# Patient Record
Sex: Male | Born: 1965 | Race: Black or African American | Hispanic: No | Marital: Single | Smoking: Former smoker
Health system: Southern US, Community
[De-identification: ages and names within clinical notes are randomized; demographics above are authoritative.]

## PROBLEM LIST (undated history)

## (undated) ENCOUNTER — Ambulatory Visit

## (undated) ENCOUNTER — Encounter: Attending: Adult Health | Primary: Adult Health

## (undated) ENCOUNTER — Ambulatory Visit: Payer: Medicare (Managed Care)

## (undated) ENCOUNTER — Encounter: Attending: Nurse Practitioner | Primary: Nurse Practitioner

## (undated) ENCOUNTER — Ambulatory Visit: Attending: Orthopaedic Surgery | Primary: Orthopaedic Surgery

## (undated) ENCOUNTER — Telehealth

## (undated) ENCOUNTER — Encounter

## (undated) ENCOUNTER — Ambulatory Visit: Payer: MEDICARE

## (undated) ENCOUNTER — Encounter: Attending: Ambulatory Care | Primary: Ambulatory Care

## (undated) ENCOUNTER — Ambulatory Visit: Attending: Cardiovascular Disease | Primary: Cardiovascular Disease

## (undated) ENCOUNTER — Ambulatory Visit: Payer: PRIVATE HEALTH INSURANCE

## (undated) ENCOUNTER — Ambulatory Visit: Payer: MEDICARE | Attending: Orthopaedic Surgery | Primary: Orthopaedic Surgery

## (undated) ENCOUNTER — Ambulatory Visit: Attending: Mental Health | Primary: Mental Health

## (undated) ENCOUNTER — Ambulatory Visit: Payer: Medicare (Managed Care) | Attending: Dermatology | Primary: Dermatology

## (undated) ENCOUNTER — Ambulatory Visit: Payer: Medicare (Managed Care) | Attending: Internal Medicine | Primary: Internal Medicine

## (undated) ENCOUNTER — Encounter: Attending: Gastroenterology | Primary: Gastroenterology

## (undated) ENCOUNTER — Ambulatory Visit: Payer: MEDICARE | Attending: Oncology | Primary: Oncology

## (undated) ENCOUNTER — Encounter: Attending: Oncology | Primary: Oncology

## (undated) ENCOUNTER — Ambulatory Visit: Payer: Medicare (Managed Care) | Attending: Adult Health | Primary: Adult Health

## (undated) ENCOUNTER — Ambulatory Visit: Payer: Medicare (Managed Care) | Attending: Hematology & Oncology | Primary: Hematology & Oncology

## (undated) ENCOUNTER — Other Ambulatory Visit

## (undated) ENCOUNTER — Ambulatory Visit: Payer: PRIVATE HEALTH INSURANCE | Attending: Adult Health | Primary: Adult Health

## (undated) ENCOUNTER — Encounter: Attending: Dermatology | Primary: Dermatology

## (undated) ENCOUNTER — Encounter: Attending: Internal Medicine | Primary: Internal Medicine

## (undated) ENCOUNTER — Ambulatory Visit: Payer: PRIVATE HEALTH INSURANCE | Attending: Cardiovascular Disease | Primary: Cardiovascular Disease

## (undated) ENCOUNTER — Encounter: Attending: Cardiovascular Disease | Primary: Cardiovascular Disease

## (undated) ENCOUNTER — Ambulatory Visit: Attending: Adult Health | Primary: Adult Health

## (undated) ENCOUNTER — Ambulatory Visit
Attending: Student in an Organized Health Care Education/Training Program | Primary: Student in an Organized Health Care Education/Training Program

## (undated) ENCOUNTER — Telehealth: Attending: Neurology | Primary: Neurology

## (undated) ENCOUNTER — Inpatient Hospital Stay: Payer: Medicare (Managed Care)

## (undated) ENCOUNTER — Telehealth
Payer: PRIVATE HEALTH INSURANCE | Attending: Student in an Organized Health Care Education/Training Program | Primary: Student in an Organized Health Care Education/Training Program

## (undated) DIAGNOSIS — I2699 Other pulmonary embolism without acute cor pulmonale: Secondary | ICD-10-CM

## (undated) DIAGNOSIS — D869 Sarcoidosis, unspecified: Secondary | ICD-10-CM

## (undated) DIAGNOSIS — K5792 Diverticulitis of intestine, part unspecified, without perforation or abscess without bleeding: Secondary | ICD-10-CM

## (undated) DIAGNOSIS — E119 Type 2 diabetes mellitus without complications: Secondary | ICD-10-CM

## (undated) DIAGNOSIS — F419 Anxiety disorder, unspecified: Secondary | ICD-10-CM

---
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
## 2002-01-13 DIAGNOSIS — I2699 Other pulmonary embolism without acute cor pulmonale: Secondary | ICD-10-CM

## 2002-01-13 HISTORY — PX: COLECTOMY: SHX59

## 2002-01-13 HISTORY — PX: COLOSTOMY REVISION: SHX5232

## 2002-01-13 HISTORY — DX: Other pulmonary embolism without acute cor pulmonale: I26.99

## 2004-12-04 ENCOUNTER — Ambulatory Visit: Payer: Self-pay

## 2005-03-17 ENCOUNTER — Ambulatory Visit: Payer: Self-pay | Admitting: Family Medicine

## 2005-06-13 ENCOUNTER — Ambulatory Visit: Payer: Self-pay

## 2005-07-01 ENCOUNTER — Ambulatory Visit: Payer: Self-pay

## 2008-03-07 ENCOUNTER — Ambulatory Visit: Payer: Self-pay | Admitting: Internal Medicine

## 2008-07-15 ENCOUNTER — Ambulatory Visit: Payer: Self-pay | Admitting: Internal Medicine

## 2008-07-29 ENCOUNTER — Ambulatory Visit: Payer: Self-pay | Admitting: Internal Medicine

## 2008-08-28 ENCOUNTER — Ambulatory Visit: Payer: Self-pay | Admitting: Internal Medicine

## 2009-04-24 ENCOUNTER — Ambulatory Visit: Payer: Self-pay | Admitting: Internal Medicine

## 2010-01-31 ENCOUNTER — Ambulatory Visit: Payer: Self-pay | Admitting: Family Medicine

## 2010-08-02 ENCOUNTER — Ambulatory Visit: Payer: Self-pay | Admitting: Internal Medicine

## 2011-08-06 ENCOUNTER — Emergency Department: Payer: Self-pay | Admitting: Emergency Medicine

## 2011-08-06 LAB — PROTIME-INR
INR: 1.3
Prothrombin Time: 16.7 secs — ABNORMAL HIGH (ref 11.5–14.7)

## 2011-08-06 LAB — BASIC METABOLIC PANEL
Calcium, Total: 9.4 mg/dL (ref 8.5–10.1)
Chloride: 104 mmol/L (ref 98–107)
Co2: 28 mmol/L (ref 21–32)
Osmolality: 277 (ref 275–301)
Potassium: 4.9 mmol/L (ref 3.5–5.1)
Sodium: 138 mmol/L (ref 136–145)

## 2011-08-06 LAB — CBC
HGB: 15.2 g/dL (ref 13.0–18.0)
MCH: 26.1 pg (ref 26.0–34.0)
RBC: 5.82 10*6/uL (ref 4.40–5.90)

## 2011-09-09 ENCOUNTER — Emergency Department: Payer: Self-pay | Admitting: Emergency Medicine

## 2011-09-22 ENCOUNTER — Ambulatory Visit: Payer: Self-pay | Admitting: Surgery

## 2011-11-08 ENCOUNTER — Ambulatory Visit: Payer: Self-pay | Admitting: Physician Assistant

## 2012-01-14 HISTORY — PX: ABDOMINAL HERNIA REPAIR: SHX539

## 2012-06-15 ENCOUNTER — Ambulatory Visit: Payer: Self-pay | Admitting: Family Medicine

## 2012-06-15 LAB — URINALYSIS, COMPLETE
Bilirubin,UR: NEGATIVE
Blood: NEGATIVE
Glucose,UR: NEGATIVE mg/dL (ref 0–75)
Nitrite: NEGATIVE
Ph: 5 (ref 4.5–8.0)
RBC,UR: NONE SEEN /HPF (ref 0–5)
Specific Gravity: 1.025 (ref 1.003–1.030)

## 2012-06-15 LAB — GC/CHLAMYDIA PROBE AMP

## 2012-08-27 ENCOUNTER — Ambulatory Visit: Payer: Self-pay | Admitting: Surgery

## 2012-08-27 LAB — CBC WITH DIFFERENTIAL/PLATELET
Basophil %: 0.9 %
Eosinophil #: 0.3 10*3/uL (ref 0.0–0.7)
Eosinophil %: 5.2 %
HCT: 45.6 % (ref 40.0–52.0)
HGB: 15.4 g/dL (ref 13.0–18.0)
Lymphocyte %: 30.5 %
MCH: 28.1 pg (ref 26.0–34.0)
MCHC: 33.9 g/dL (ref 32.0–36.0)
Monocyte #: 0.5 x10 3/mm (ref 0.2–1.0)
Neutrophil #: 2.9 10*3/uL (ref 1.4–6.5)
Neutrophil %: 53.5 %
Platelet: 196 10*3/uL (ref 150–440)
WBC: 5.5 10*3/uL (ref 3.8–10.6)

## 2012-08-27 LAB — COMPREHENSIVE METABOLIC PANEL
Alkaline Phosphatase: 56 U/L (ref 50–136)
Anion Gap: 5 — ABNORMAL LOW (ref 7–16)
BUN: 18 mg/dL (ref 7–18)
Bilirubin,Total: 0.3 mg/dL (ref 0.2–1.0)
Calcium, Total: 9.3 mg/dL (ref 8.5–10.1)
Co2: 27 mmol/L (ref 21–32)
Creatinine: 1.28 mg/dL (ref 0.60–1.30)
EGFR (African American): >60
Glucose: 93 mg/dL (ref 65–99)
SGOT(AST): 30 U/L (ref 15–37)
SGPT (ALT): 42 U/L (ref 12–78)
Sodium: 139 mmol/L (ref 136–145)
Total Protein: 7.8 g/dL (ref 6.4–8.2)

## 2012-08-30 ENCOUNTER — Ambulatory Visit: Payer: Self-pay | Admitting: Surgery

## 2012-09-17 ENCOUNTER — Ambulatory Visit: Payer: Self-pay | Admitting: Family Medicine

## 2013-02-09 ENCOUNTER — Emergency Department: Payer: Self-pay | Admitting: Emergency Medicine

## 2013-02-09 LAB — CBC
HCT: 46.1 % (ref 40.0–52.0)
HGB: 15.3 g/dL (ref 13.0–18.0)
MCH: 27.3 pg (ref 26.0–34.0)
MCHC: 33.1 g/dL (ref 32.0–36.0)
MCV: 83 fL (ref 80–100)
PLATELETS: 183 10*3/uL (ref 150–440)
RBC: 5.59 10*6/uL (ref 4.40–5.90)
RDW: 14.3 % (ref 11.5–14.5)
WBC: 4 10*3/uL (ref 3.8–10.6)

## 2013-02-09 LAB — COMPREHENSIVE METABOLIC PANEL
Albumin: 3.6 g/dL (ref 3.4–5.0)
Alkaline Phosphatase: 47 U/L
Anion Gap: 4 — ABNORMAL LOW (ref 7–16)
BILIRUBIN TOTAL: 0.4 mg/dL (ref 0.2–1.0)
BUN: 14 mg/dL (ref 7–18)
Calcium, Total: 8.8 mg/dL (ref 8.5–10.1)
Chloride: 106 mmol/L (ref 98–107)
Co2: 27 mmol/L (ref 21–32)
Creatinine: 1.34 mg/dL — ABNORMAL HIGH (ref 0.60–1.30)
EGFR (African American): >60
EGFR (Non-African Amer.): >60
GLUCOSE: 107 mg/dL — AB (ref 65–99)
OSMOLALITY: 275 (ref 275–301)
POTASSIUM: 4.1 mmol/L (ref 3.5–5.1)
SGOT(AST): 36 U/L (ref 15–37)
SGPT (ALT): 49 U/L (ref 12–78)
SODIUM: 137 mmol/L (ref 136–145)
TOTAL PROTEIN: 7.9 g/dL (ref 6.4–8.2)

## 2013-02-09 LAB — TROPONIN I: Troponin-I: <0.02 ng/mL

## 2013-02-09 LAB — PROTIME-INR
INR: 1.3
PROTHROMBIN TIME: 15.9 s — AB (ref 11.5–14.7)

## 2013-07-22 IMAGING — CT CT ABD-PELV W/O CM
1 of 2 series · 15 of 32 positions shown, 19 images · non-contrast
Comparison: None

REASON FOR EXAM: Lower central abd pain Hx Hernia Eval diverticulitis
COMMENTS:

PROCEDURE:     CT  - CT ABDOMEN AND PELVIS W[DATE]  [DATE]
RESULT:     Indication: 09/22/2011
TECHNIQUE: Multiple axial images from the lung bases to the symphysis pubis
were obtained without oral and without intravenous contrast.

[Series 2: soft tissue · axial · 0.80mm/px · z∈[-870,-366]mm · 15 of 185 slices shown, 19 images]
[im 9/185  soft-tissue]
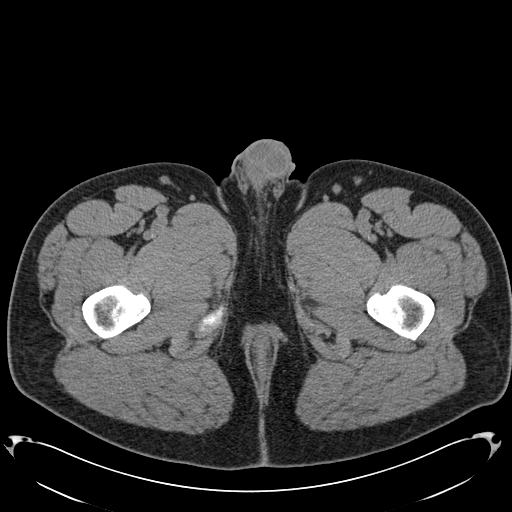
[im 9/185  bone]
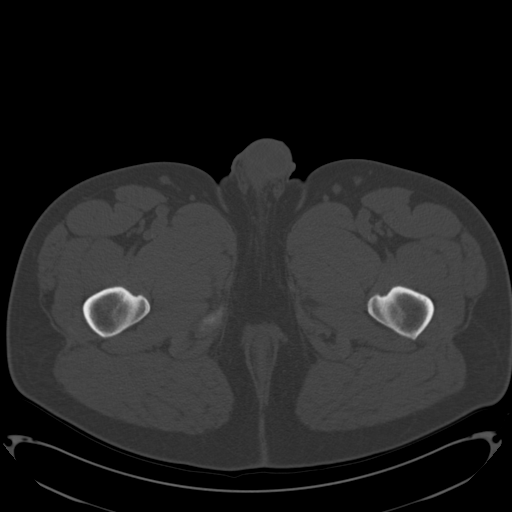
[im 25/185  soft-tissue]
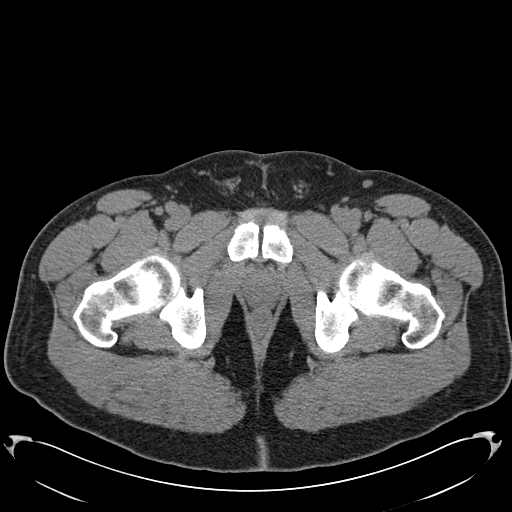
[im 41/185  soft-tissue]
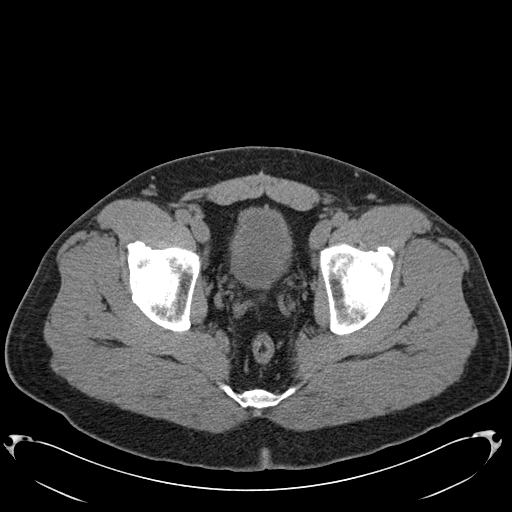
[im 49/185  soft-tissue]
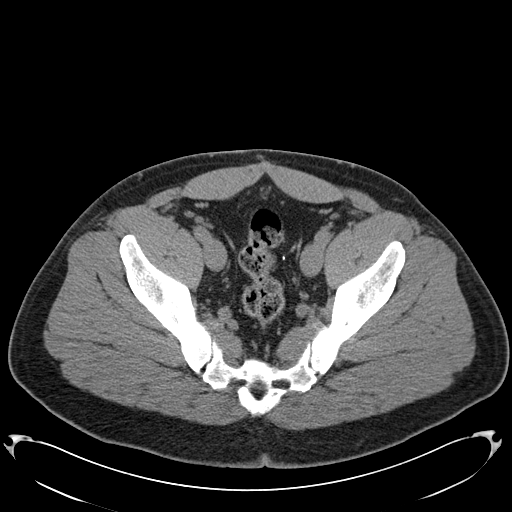
[im 65/185  soft-tissue]
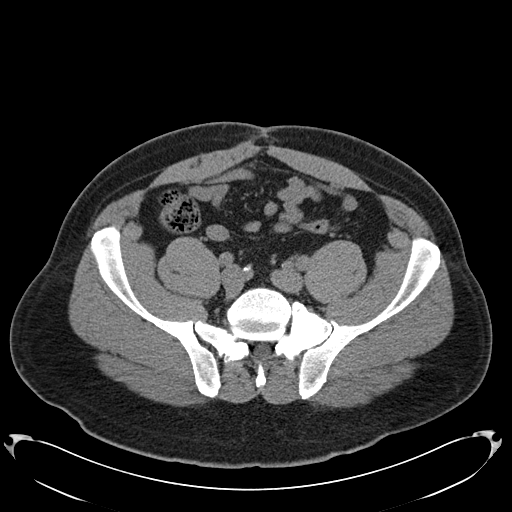
[im 81/185  soft-tissue]
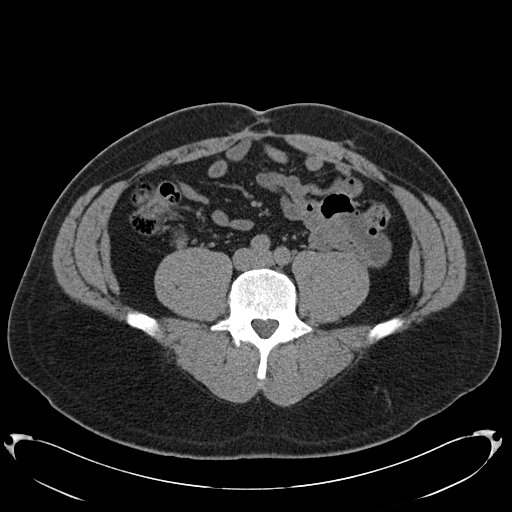
[im 97/185  soft-tissue]
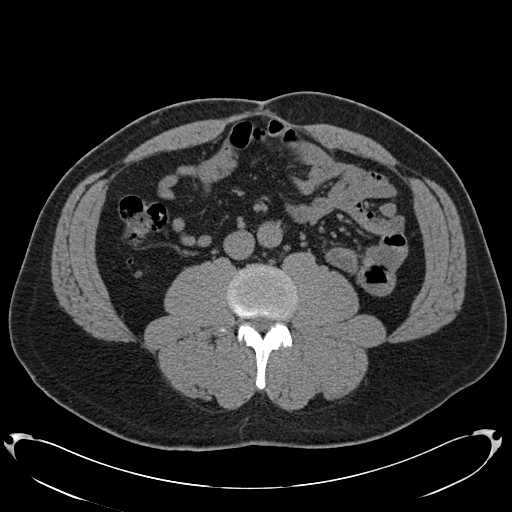
[im 105/185  soft-tissue]
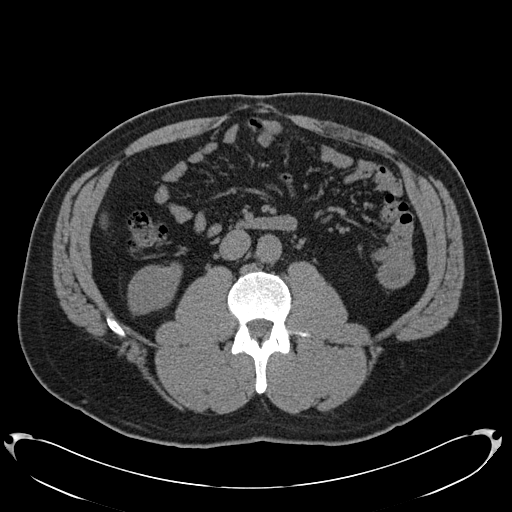
[im 121/185  soft-tissue]
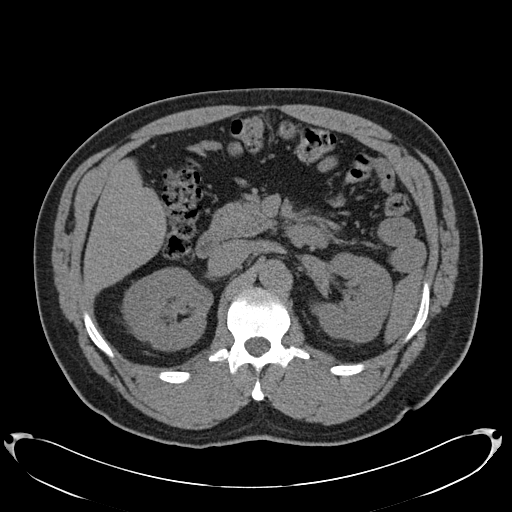
[im 121/185  bone]
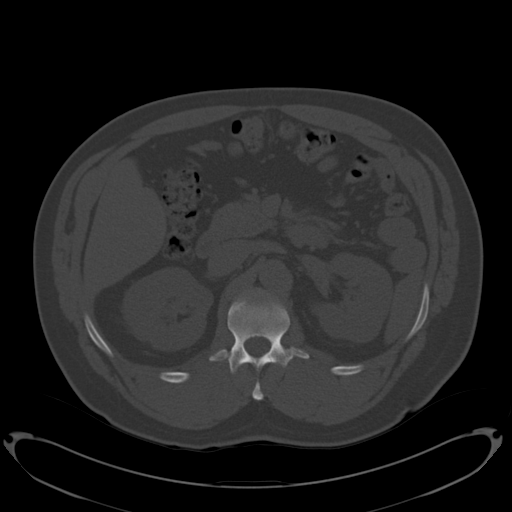
[im 137/185  soft-tissue]
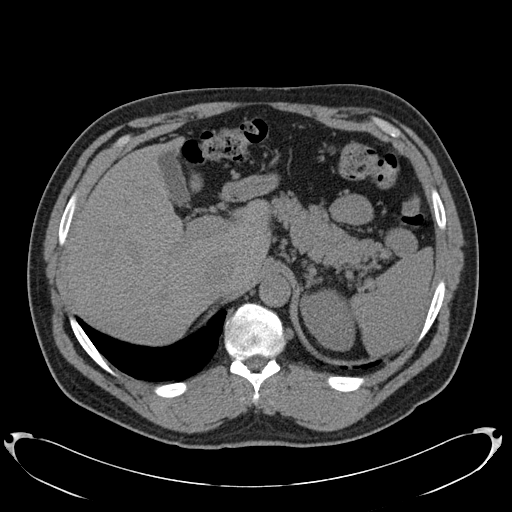
[im 145/185  soft-tissue]
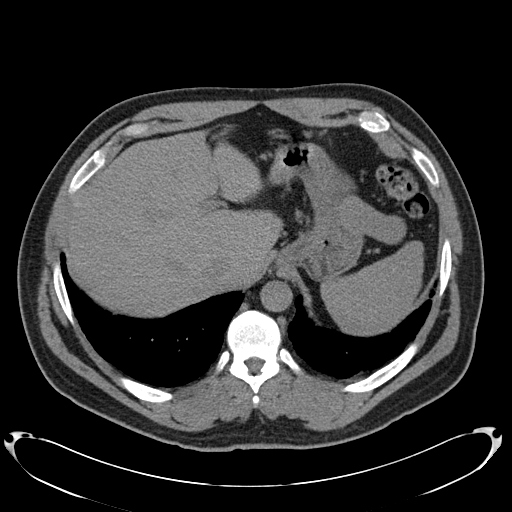
[im 153/185  lung]
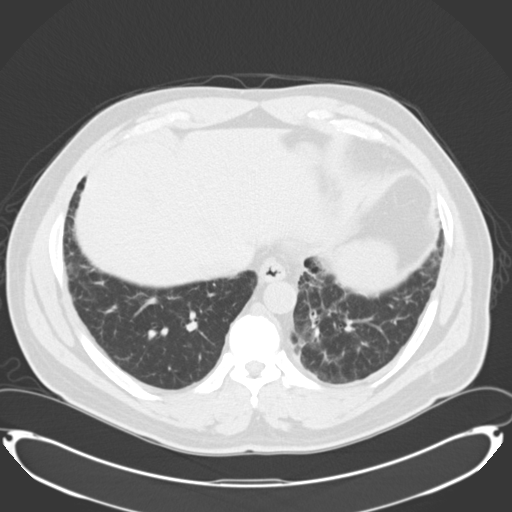
[im 161/185  soft-tissue]
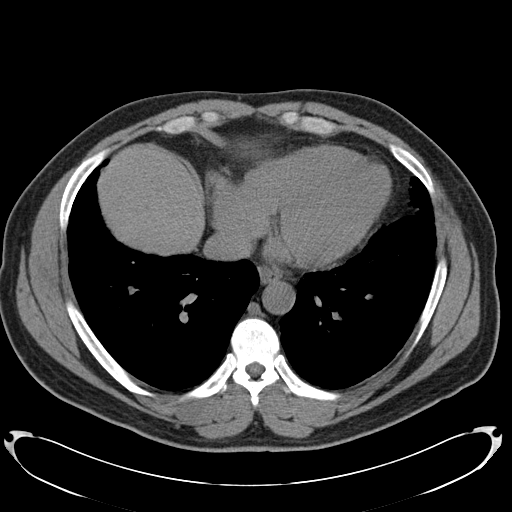
[im 161/185  lung]
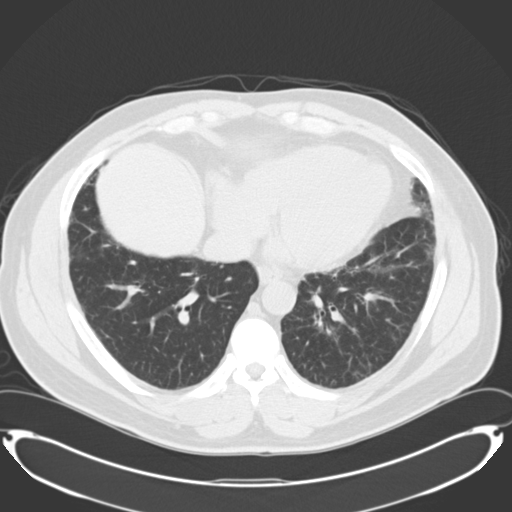
[im 169/185  lung]
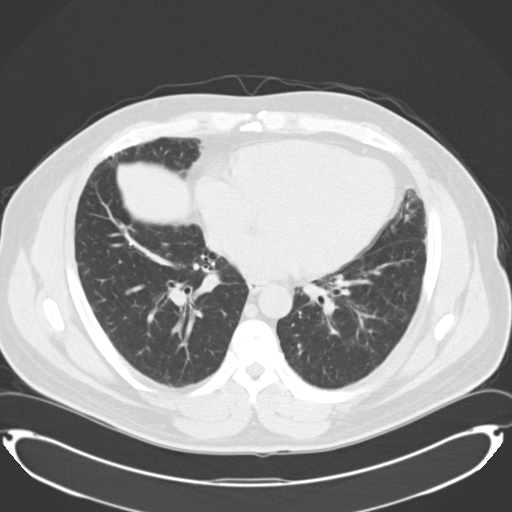
[im 177/185  soft-tissue]
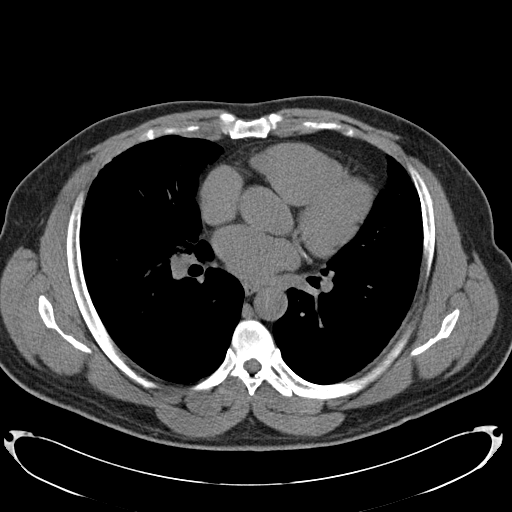
[im 177/185  lung]
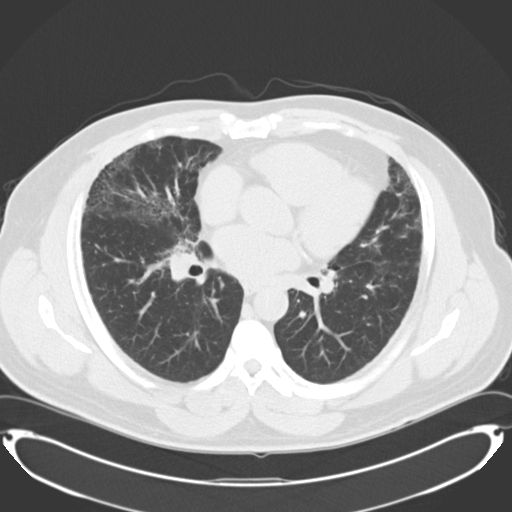

[15 of 32 positions shown; findings below may reference images not displayed]

FINDINGS: The lung bases are clear. There is no pleural or pericardial effusions.

No renal, ureteral, or bladder calculi. No obstructive uropathy. No
perinephric stranding is seen. The kidneys are symmetric in size without
evidence for exophytic mass. The bladder is unremarkable.

The liver demonstrates no focal abnormality. The gallbladder is
unremarkable. The spleen demonstrates no focal abnormality. The adrenal
glands and pancreas are normal.

The unopacified stomach, duodenum, small intestine, and large intestine are
unremarkable, but evaluation is limited by lack of oral contrast. There is a
sigmoid colon anastomotic suture line noted from prior resection. There is a
normal caliber appendix in the right lower quadrant without periappendiceal
inflammatory changes. There is a small fat-containing right paramedian
hernia. There is no pneumoperitoneum, pneumatosis, or portal venous gas.
There is no abdominal or pelvic free fluid. There is no lymphadenopathy.

The abdominal aorta is normal in caliber.

The osseous structures are unremarkable.
IMPRESSION: 1.There is a small fat-containing right paramedian hernia.

2. Prior sigmoid colon resection with anastomosis without focal surrounding
abnormality.

[REDACTED]

## 2013-09-07 ENCOUNTER — Emergency Department: Payer: Self-pay | Admitting: Emergency Medicine

## 2013-09-07 LAB — BASIC METABOLIC PANEL
Anion Gap: 9 (ref 7–16)
BUN: 17 mg/dL (ref 7–18)
CALCIUM: 8.9 mg/dL (ref 8.5–10.1)
CHLORIDE: 106 mmol/L (ref 98–107)
CO2: 27 mmol/L (ref 21–32)
Creatinine: 1.46 mg/dL — ABNORMAL HIGH (ref 0.60–1.30)
EGFR (African American): >60
EGFR (Non-African Amer.): 56 — ABNORMAL LOW
GLUCOSE: 119 mg/dL — AB (ref 65–99)
Osmolality: 286 (ref 275–301)
Potassium: 4 mmol/L (ref 3.5–5.1)
Sodium: 142 mmol/L (ref 136–145)

## 2013-09-07 LAB — CBC
HCT: 45.2 % (ref 40.0–52.0)
HGB: 15 g/dL (ref 13.0–18.0)
MCH: 28 pg (ref 26.0–34.0)
MCHC: 33.2 g/dL (ref 32.0–36.0)
MCV: 84 fL (ref 80–100)
Platelet: 197 10*3/uL (ref 150–440)
RBC: 5.35 10*6/uL (ref 4.40–5.90)
RDW: 15 % — ABNORMAL HIGH (ref 11.5–14.5)
WBC: 5.9 10*3/uL (ref 3.8–10.6)

## 2013-09-07 LAB — PROTIME-INR
INR: 2.2
PROTHROMBIN TIME: 24.1 s — AB (ref 11.5–14.7)

## 2013-09-28 ENCOUNTER — Ambulatory Visit: Payer: Self-pay | Admitting: Physician Assistant

## 2014-05-05 NOTE — Op Note (Signed)
PATIENT NAME:  David Robles, David Robles MR#:  [id number] DATE OF BIRTH:  03/25/1965  DATE OF PROCEDURE:Lenise Herald  08/31/2012  DATE OF REDICTATION:  09/01/2012  PREOPERATIVE DIAGNOSIS: Symptomatic ventral hernia incarcerated.   POSTOPERATIVE DIAGNOSIS:  Symptomatic  incisional  ventral hernia, incarcerated.   PROCEDURE PERFORMED:  Ventral hernia repair with mesh.   SURGEON: Natale LayMark Meoshia Billing, M.Robles.   ASSISTANT: None.   ANESTHESIA: General endotracheal.   FINDINGS:  A 1 cm fascial defect with incarcerated preperitoneal fat. The hernia defect was approximately 1 cm and to the left of the midline.   SPECIMENS: Hernia contents to pathology.   ESTIMATED BLOOD LOSS: Minimal.   DESCRIPTION OF PROCEDURE: With informed consent, supine position, general endotracheal anesthesia, the patient's abdomen was widely prepped and draped with ChloraPrep solution followed by Ioban draping. Timeout was observed. The hernia was above the umbilicus and to the left of the midline. The previous midline scar was opened sharply with scalpel and carried down with sharp and electrocautery dissection to the hernia sac. The fascia was opened laterally, extending the hernia defect at the midline. The midline was then incised with a scalpel and electrocautery. Adhesiolysis was then achieved circumferentially underneath the fascia such that mesh could be placed. As I could not clearly separate the preperitoneal space from the remaining fascia I elected to use a piece of Physiomesh which was brought onto the field, cut to the appropriate size with 3 cm overlap and then secured at multiple points with mattress-type sutures of #0 Ethibond suture. The hernia defect was then closed overlying the mesh utilizing interrupted figure-of-eight #0 Vicryl suture. Subcutaneous tissues in the mesh were irrigated with GU irrigant. Hemostasis was ensured on the operative field with point cautery in the subcutaneous fat. The wound was then reapproximated in multiple layers  of 3-0 Vicryl, skin staples and 3-0 nylon in the skin. A sterile occlusive dressing followed by abdominal binder was placed. The patient was subsequently extubated and taken to the recovery room in stable and satisfactory condition by anesthesia service    ____________________________ Redge GainerMark A. Egbert GaribaldiBird, MD mab:cc Robles: 09/01/2012 09:44:46 ET T: 09/01/2012 14:52:51 ET JOB#: [id number]  cc: Loraine LericheMark A. Egbert GaribaldiBird, MD, <Dictator>

## 2014-05-05 NOTE — Op Note (Signed)
PATIENT NAME:  David HeraldGRAVES, [name] MR#:  [id number] DATE OF BIRTH:  07-26-1965  DATE OF PROCEDURE:  08/30/2012  PREOPERATIVE DIAGNOSIS: Symptomatic incisional ventral hernia.   POSTOPERATIVE DIAGNOSIS:  Symptomatic incisional ventral hernia, incarcerated.  PROCEDURE PERFORMED:  Ventral hernia repair, with mesh.  SURGEON:   Oddie Kuhlmann A. Egbert GaribaldiBird, MD  ASSISTANT:   None.  ANESTHESIA: General endotracheal.  FINDINGS:  1.  One centimeter fascial defect incarcerated within the peritoneal fat.  2.  Hernia defect, with (Dictation Anomaly) <<MISSING TEXT>>.  SPECIMENS:  Contents, sent to pathology.  ESTIMATED BLOOD LOSS: (Dictation Anomaly) <<MISSING TEXT>>.   DESCRIPTION OF PROCEDURE: With the patient in the supine position (Dictation Anomaly) <<MISSING TEXT>>. (Dictation Anomaly) <<MISSING TEXT>>. Opened with a scalpel. Carried down with electrocautery to the fascia.    The hernia defect was wide, with the fascia manually to the midline. (Dictation Anomaly) the fascia was then cleared eventually with adhesions (Dictation Anomaly) <<MISSING TEXT>> with sharp dissection. A 1 cm fascial defect was incarcerated within the peritoneal fat. The subcutaneous defect was left (Dictation Anomaly) <<MISSING TEXT>>.  The specimens and contents were sent to the pathologists.  utilizing mattress-type interrupted sutures of #0 Ethibond. The abdomen was also herniated, and was then reapproximated over the mesh using (Dictation Anomaly) <<MISSING TEXT>> figure-of-eight 0 Vicryl suture.   The wound was then copiously irrigated. A dermal layer of interrupted, inverted 0 Vicryl suture was placed. The incision was reapproximated with a stapler and some vertical mattress sutures. A sterile dressing was applied followed by Dermabond.  The patient was then  subsequently extubated and taken to the recovery room in stable and satisfactory condition.    ____________________________ Redge GainerMark A. Egbert GaribaldiBird, MD mab:dm D: 08/31/2012  09:21:00 ET T: 08/31/2012 13:12:48 ET JOB#: [id number]  cc: Loraine LericheMark A. Egbert GaribaldiBird, MD, <Dictator>

## 2014-05-05 NOTE — Op Note (Signed)
PATIENT NAME:  Lenise HeraldGRAVES, Demarquez D MR#:  [id number] DATE OF BIRTH:  [birth year]  DATE OF PROCEDURE:  08/30/2012  PREOPERATIVE DIAGNOSIS: Symptomatic ventral hernia incarcerated.   POSTOPERATIVE DIAGNOSIS:  Symptomatic  incisional  ventral hernia, incarcerated.   PROCEDURE PERFORMED:  Ventral hernia repair with mesh.   SURGEON: Natale LayMark Casia Corti, M.D. FACS  ASSISTANT: scrub tech  ANESTHESIA: General oroendotracheal.   FINDINGS:  A 1 cm fascial defect with incarcerated preperitoneal fat. The hernia defect was approximately 1 cm and to the left of the midline.   SPECIMENS: Hernia contents to pathology.   ESTIMATED BLOOD LOSS: Minimal.   DESCRIPTION OF PROCEDURE: With informed consent, supine position, general endotracheal anesthesia, the patient's abdomen was widely prepped and draped with ChloraPrep solution followed by Ioban draping. Timeout was observed. The hernia was above the umbilicus and to the left of the midline. The previous midline scar was opened sharply with scalpel and carried down with sharp and electrocautery dissection to the hernia sac. The fascia was opened laterally, extending the hernia defect at the midline. The midline was then incised with a scalpel and electrocautery. Adhesiolysis was then achieved circumferentially underneath the fascia such that mesh could be placed. As I could not clearly separate the preperitoneal space from the remaining fascia I elected to use a piece of Physiomesh which was brought onto the field, cut to the appropriate size with 3 cm overlap and then secured at multiple points with mattress-type sutures of #0 Ethibond suture. The hernia defect was then closed overlying the mesh utilizing interrupted figure-of-eight #0 Vicryl suture. Subcutaneous tissues in the mesh were irrigated with GU irrigant. Hemostasis was ensured on the operative field with point cautery in the subcutaneous fat. The wound was then reapproximated in multiple layers of 3-0 Vicryl, skin  staples and 3-0 nylon in the skin. A sterile occlusive dressing followed by abdominal binder was placed. The patient was subsequently extubated and taken to the recovery room in stable and satisfactory condition by anesthesia service    ____________________________ Redge GainerMark A. Egbert GaribaldiBird, MD mab:cc D: 09/01/2012 09:44:00 ET T: 09/01/2012 14:52:51 ET JOB#: [id number]  cc: Loraine LericheMark A. Egbert GaribaldiBird, MD, <Dictator> Raynald KempMARK A Mikyle Sox MD ELECTRONICALLY SIGNED 09/02/2012 13:29

## 2014-05-26 ENCOUNTER — Ambulatory Visit: Payer: Managed Care, Other (non HMO)

## 2014-05-26 ENCOUNTER — Ambulatory Visit
Admission: EM | Admit: 2014-05-26 | Discharge: 2014-05-26 | Disposition: A | Discharge status: Home / Self Care | Payer: Managed Care, Other (non HMO) | Attending: Family Medicine | Admitting: Family Medicine

## 2014-05-26 DIAGNOSIS — R319 Hematuria, unspecified: Secondary | ICD-10-CM | POA: Insufficient documentation

## 2014-05-26 DIAGNOSIS — D869 Sarcoidosis, unspecified: Secondary | ICD-10-CM | POA: Insufficient documentation

## 2014-05-26 DIAGNOSIS — F17210 Nicotine dependence, cigarettes, uncomplicated: Secondary | ICD-10-CM | POA: Diagnosis not present

## 2014-05-26 DIAGNOSIS — N200 Calculus of kidney: Secondary | ICD-10-CM | POA: Diagnosis not present

## 2014-05-26 DIAGNOSIS — Z86711 Personal history of pulmonary embolism: Secondary | ICD-10-CM | POA: Diagnosis not present

## 2014-05-26 HISTORY — DX: Other pulmonary embolism without acute cor pulmonale: I26.99

## 2014-05-26 HISTORY — DX: Sarcoidosis, unspecified: D86.9

## 2014-05-26 HISTORY — DX: Diverticulitis of intestine, part unspecified, without perforation or abscess without bleeding: K57.92

## 2014-05-26 LAB — URINALYSIS COMPLETE WITH MICROSCOPIC (ARMC ONLY)
Bacteria, UA: NONE SEEN — AB
Bilirubin Urine: NEGATIVE
Glucose, UA: NEGATIVE mg/dL
KETONES UR: NEGATIVE mg/dL
Leukocytes, UA: NEGATIVE
NITRITE: NEGATIVE
Protein, ur: NEGATIVE mg/dL
Specific Gravity, Urine: 1.02 (ref 1.005–1.030)
Squamous Epithelial / LPF: NONE SEEN — AB
pH: 6 (ref 5.0–8.0)

## 2014-05-26 MED ORDER — CIPROFLOXACIN HCL 500 MG PO TABS: 500.0000 mg | ORAL_TABLET | Freq: Two times a day (BID) | ORAL | Status: DC

## 2014-05-26 NOTE — Discharge Instructions (Signed)
Prostatitis The prostate gland is about the size and shape of a walnut. It is located just below your bladder. It produces one of the components of semen, which is made up of sperm and the fluids that help nourish and transport it out from the testicles. Prostatitis is inflammation of the prostate gland.  There are four types of prostatitis:  Acute bacterial prostatitis. This is the least common type of prostatitis. It starts quickly and usually is associated with a bladder infection, high fever, and shaking chills. It can occur at any age.  Chronic bacterial prostatitis. This is a persistent bacterial infection in the prostate. It usually develops from repeated acute bacterial prostatitis or acute bacterial prostatitis that was not properly treated. It can occur in men of any age but is most common in middle-aged men whose prostate has begun to enlarge. The symptoms are not as severe as those in acute bacterial prostatitis. Discomfort in the part of your body that is in front of your rectum and below your scrotum (perineum), lower abdomen, or in the head of your penis (glans) may represent your primary discomfort.  Chronic prostatitis (nonbacterial). This is the most common type of prostatitis. It is inflammation of the prostate gland that is not caused by a bacterial infection. The cause is unknown and may be associated with a viral infection or autoimmune disorder.  Prostatodynia (pelvic floor disorder). This is associated with increased muscular tone in the pelvis surrounding the prostate. CAUSES The causes of bacterial prostatitis are bacterial infection. The causes of the other types of prostatitis are unknown.  SYMPTOMS  Symptoms can vary depending upon the type of prostatitis that exists. There can also be overlap in symptoms. Possible symptoms for each type of prostatitis are listed below. Acute Bacterial Prostatitis  Painful urination.  Fever or chills.  Muscle or joint pains.  Low  back pain.  Low abdominal pain.  Inability to empty bladder completely. Chronic Bacterial Prostatitis, Chronic Nonbacterial Prostatitis, and Prostatodynia  Sudden urge to urinate.  Frequent urination.  Difficulty starting urine stream.  Weak urine stream.  Discharge from the urethra.  Dribbling after urination.  Rectal pain.  Pain in the testicles, penis, or tip of the penis.  Pain in the perineum.  Problems with sexual function.  Painful ejaculation.  Bloody semen. DIAGNOSIS  In order to diagnose prostatitis, your health care provider will ask about your symptoms. One or more urine samples will be taken and tested (urinalysis). If the urinalysis result is negative for bacteria, your health care provider may use a finger to feel your prostate (digital rectal exam). This exam helps your health care provider determine if your prostate is swollen and tender. It will also produce a specimen of semen that can be analyzed. TREATMENT  Treatment for prostatitis depends on the cause. If a bacterial infection is the cause, it can be treated with antibiotic medicine. In cases of chronic bacterial prostatitis, the use of antibiotics for up to 1 month or 6 weeks may be necessary. Your health care provider may instruct you to take sitz baths to help relieve pain. A sitz bath is a bath of hot water in which your hips and buttocks are under water. This relaxes the pelvic floor muscles and often helps to relieve the pressure on your prostate. HOME CARE INSTRUCTIONS   Take all medicines as directed by your health care provider.  Take sitz baths as directed by your health care provider. SEEK MEDICAL CARE IF:   Your symptoms  get worse, not better.  You have a fever. SEEK IMMEDIATE MEDICAL CARE IF:   You have chills.  You feel nauseous or vomit.  You feel lightheaded or faint.  You are unable to urinate.  You have blood or blood clots in your urine. MAKE SURE YOU:  Understand  these instructions.  Will watch your condition.  Will get help right away if you are not doing well or get worse. Document Released: 12/28/1999 Document Revised: 01/04/2013 Document Reviewed: 07/19/2012 [facility] Patient Information 2015 [employer], [place]. This information is not intended to replace advice given to you by your health care provider. Make sure you discuss any questions you have with your health care provider. Hematuria Hematuria is blood in your urine. It can be caused by a bladder infection, kidney infection, prostate infection, kidney stone, or cancer of your urinary tract. Infections can usually be treated with medicine, and a kidney stone usually will pass through your urine. If neither of these is the cause of your hematuria, further workup to find out the reason may be needed. It is very important that you tell your health care provider about any blood you see in your urine, even if the blood stops without treatment or happens without causing pain. Blood in your urine that happens and then stops and then happens again can be a symptom of a very serious condition. Also, pain is not a symptom in the initial stages of many urinary cancers. HOME CARE INSTRUCTIONS   Drink lots of fluid, 3-4 quarts a day. If you have been diagnosed with an infection, cranberry juice is especially recommended, in addition to large amounts of water.  Avoid caffeine, tea, and carbonated beverages because they tend to irritate the bladder.  Avoid alcohol because it may irritate the prostate.  Take all medicines as directed by your health care provider.  If you were prescribed an antibiotic medicine, finish it all even if you start to feel better.  If you have been diagnosed with a kidney stone, follow your health care provider's instructions regarding straining your urine to catch the stone.  Empty your bladder often. Avoid holding urine for long periods of time.  After a bowel movement, women  should cleanse front to back. Use each tissue only once.  Empty your bladder before and after sexual intercourse if you are a male. SEEK MEDICAL CARE IF:  You develop back pain.  You have a fever.  You have a feeling of sickness in your stomach (nausea) or vomiting.  Your symptoms are not better in 3 days. Return sooner if you are getting worse. SEEK IMMEDIATE MEDICAL CARE IF:   You develop severe vomiting and are unable to keep the medicine down.  You develop severe back or abdominal pain despite taking your medicines.  You begin passing a large amount of blood or clots in your urine.  You feel extremely weak or faint, or you pass out. MAKE SURE YOU:   Understand these instructions.  Will watch your condition.  Will get help right away if you are not doing well or get worse. Document Released: 12/30/2004 Document Revised: 05/16/2013 Document Reviewed: 08/30/2012 [facility] Patient Information 2015 [place], [place]. This information is not intended to replace advice given to you by your health care provider. Make sure you discuss any questions you have with your health care provider.

## 2014-05-26 NOTE — ED Notes (Signed)
Slight low back pain x 2 days, last night noted "a couple of drops of blood" in urine. Denies fever, no N/V

## 2014-05-26 NOTE — ED Provider Notes (Addendum)
CSN: [id number]     Arrival date & time 05/26/14  1555 History   First MD Initiated Contact with Patient 05/26/14 1728     Chief Complaint  Patient presents with  . Hematuria   (Consider location/radiation/quality/duration/timing/severity/associated sxs/prior Treatment) HPI Comments: African [place] male new blood in urine denied history of kidney stones, prostatitis, UTI, changes in sexual partners, fever, chills, nausea, vomiting, diarrhea, hemorrhoids.  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  Patient is a 49 y.o. male presenting with hematuria. The history is provided by the patient.  Hematuria This is a new problem. The current episode started 12 to 24 hours ago. The problem has not changed since onset.Pertinent negatives include no chest pain, no abdominal pain, no headaches and no shortness of breath. Nothing aggravates the symptoms. Nothing relieves the symptoms. He has tried nothing for the symptoms.    Past Medical History  Diagnosis Date  . Pulmonary embolism 2004  . Diverticulitis   . Sarcoidosis    Past Surgical History  Procedure Laterality Date  . Abdominal hernia repair  2014  . Colectomy  2004  . Colostomy revision  2004   Family History  Problem Relation Age of Onset  . Stroke Father    History  Substance Use Topics  . Smoking status: Current Some Day Smoker    Types: Cigarettes  . Smokeless tobacco: Not on file  . Alcohol Use: Yes     Comment: socially    Review of Systems  Constitutional: Negative for fever, chills, diaphoresis, activity change, appetite change, fatigue and unexpected weight change.  HENT: Negative for mouth sores, nosebleeds, sore throat and trouble swallowing.   Eyes: Negative for photophobia, pain, discharge, redness, itching and visual disturbance.  Respiratory: Negative for cough, shortness of breath and wheezing.   Cardiovascular: Negative for chest pain, palpitations and leg swelling.  Gastrointestinal: Negative for nausea, vomiting,  abdominal pain, diarrhea, constipation, blood in stool and anal bleeding.  Endocrine: Negative for polydipsia, polyphagia and polyuria.  Genitourinary: Positive for hematuria. Negative for dysuria, urgency, frequency, flank pain, decreased urine volume, discharge, penile swelling, scrotal swelling, enuresis, difficulty urinating, genital sores, penile pain and testicular pain.  Musculoskeletal: Positive for myalgias. Negative for back pain, joint swelling, arthralgias, gait problem, neck pain and neck stiffness.  Skin: Negative for color change, pallor, rash and wound.  Allergic/Immunologic: Negative for environmental allergies and food allergies.  Neurological: Negative for dizziness, tremors, syncope, speech difficulty, weakness, light-headedness and headaches.  Hematological: Negative for adenopathy. Does not bruise/bleed easily.  Psychiatric/Behavioral: Negative for behavioral problems, confusion, sleep disturbance and agitation. The patient is not nervous/anxious.     Allergies  Review of patient's allergies indicates no known allergies.  Home Medications   Prior to Admission medications   Medication Sig Start Date End Date Taking? Authorizing Provider  hydrochlorothiazide (HYDRODIURIL) 25 MG tablet Take 25 mg by mouth daily.   Yes Historical Provider, MD  warfarin (COUMADIN) 1 MG tablet Take 9 mg by mouth daily.   Yes Historical Provider, MD  ciprofloxacin (CIPRO) 500 MG tablet Take 1 tablet (500 mg total) by mouth 2 (two) times daily. 05/26/14   Jarold Songina A Nailah Luepke, NP   BP 139/98 mmHg  Pulse 54  Temp(Src) 96.7 F (35.9 C) (Tympanic)  Resp 16  Ht 6' (1.829 m)  Wt 255 lb (115.667 kg)  BMI 34.58 kg/m2  SpO2 98% Physical Exam  Constitutional: He is oriented to person, place, and time. Vital signs are normal. He appears well-developed and well-nourished. No distress.  HENT:  Head: Normocephalic and atraumatic.  Mouth/Throat: Oropharynx is clear and moist.  Eyes: Conjunctivae, EOM  and lids are normal. Pupils are equal, round, and reactive to light.  Neck: Trachea normal and normal range of motion. Neck supple. No JVD present. No tracheal deviation present. No thyromegaly present.  Cardiovascular: Normal rate, regular rhythm, normal heart sounds and intact distal pulses.  Exam reveals no gallop and no friction rub.   No murmur heard. Pulmonary/Chest: Effort normal and breath sounds normal. No respiratory distress. He has no wheezes. He has no rales. He exhibits no tenderness.  Abdominal: Soft. He exhibits no shifting dullness, no distension, no pulsatile liver, no fluid wave, no abdominal bruit, no ascites, no pulsatile midline mass and no mass. Bowel sounds are decreased. There is no hepatosplenomegaly, splenomegaly or hepatomegaly. There is no tenderness. There is no rebound, no guarding and no CVA tenderness.  Musculoskeletal: Normal range of motion. He exhibits no edema or tenderness.       Lumbar back: He exhibits pain. He exhibits normal range of motion, no tenderness, no bony tenderness, no swelling, no edema, no deformity, no laceration, no spasm and normal pulse.       Back:  Lymphadenopathy:    He has no cervical adenopathy.  Neurological: He is alert and oriented to person, place, and time.  Skin: Skin is warm, dry and intact. No rash noted. He is not diaphoretic. No erythema. No pallor.  Psychiatric: He has a normal mood and affect. His speech is normal and behavior is normal. Judgment and thought content normal. Cognition and memory are normal.  Nursing note and vitals reviewed.   ED Course  Procedures (including critical care time) Labs Review Labs Reviewed  URINALYSIS COMPLETEWITH MICROSCOPIC [facility])  - Abnormal; Notable for the following:    Hgb urine dipstick TRACE (*)    Bacteria, UA NONE SEEN (*)    Squamous Epithelial / LPF NONE SEEN (*)    All other components within normal limits  URINE CULTURE    Imaging Review No results found. 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  Urinalysis with trace blood 0-5 RBCs and WBCs.  Urine culture pending.  Consider prostatitis will treat with ciprofloxacin and patient to follow up with PCM if worsening gross hematuria, back pain, fever, chills, dysuria, unable to void go to ER this weekend for re-evaluation.  Discussed with patient ciprofloxacin can interact with his coumadin and if he starts it this weekend to follow up with his coumadin clinic on Monday for labs.  Exitcare handout on nephrolithiasis and prostatis given to patient. Urine culture results will be available in 48 hours and notified I would contact him via telephone once results available.  Patient verbalized agreement and understanding of treatment plan and had no further questions at this time. P2:  Hydrate, post coital urination, and cranberry juice  29 May 2014 0837 Spoke to patient via telephone urine culture normal/negative.  Patient reported he started ciprofloxacin today and was going to call his coumadin clinic this am once they opened.  Discussed with patient does not appear he has UTI; diagnosis hematuria probable prostatitis to follow up with PCM after course of antibiotics for repeat urinalysis.  Sooner if worsening hematuria or new/worsening symptoms e.g. Dysuria, fever, flank pain, chills, unable to urinate, or urinary frequency.  Patient verbalized understanding of information/instructions agreed with plan of care and had no further questions at this  time.  MDM   1. Hematuria   2. Right nephrolithiasis        Barbaraann Barthel, NP 05/26/14 2055  Barbaraann Barthel, NP 05/26/14 0981  Barbaraann Barthel, NP 05/29/14 [phone]

## 2014-05-28 LAB — URINE CULTURE
Culture: NO GROWTH
Special Requests: NORMAL

## 2014-07-06 ENCOUNTER — Ambulatory Visit
Admission: RE | Admit: 2014-07-06 | Discharge: 2014-07-06 | Disposition: A | Discharge status: Home / Self Care | Payer: Managed Care, Other (non HMO) | Source: Ambulatory Visit | Attending: Emergency Medicine | Admitting: Emergency Medicine

## 2014-07-06 ENCOUNTER — Ambulatory Visit
Admission: EM | Admit: 2014-07-06 | Discharge: 2014-07-06 | Disposition: A | Discharge status: Home / Self Care | Payer: Managed Care, Other (non HMO) | Attending: Internal Medicine | Admitting: Internal Medicine

## 2014-07-06 ENCOUNTER — Ambulatory Visit
Admission: EM | Admit: 2014-07-06 | Discharge: 2014-07-06 | Disposition: A | Discharge status: Home / Self Care | Payer: Managed Care, Other (non HMO) | Source: Ambulatory Visit

## 2014-07-06 ENCOUNTER — Ambulatory Visit: Payer: Managed Care, Other (non HMO)

## 2014-07-06 DIAGNOSIS — Z86718 Personal history of other venous thrombosis and embolism: Secondary | ICD-10-CM

## 2014-07-06 DIAGNOSIS — I82811 Embolism and thrombosis of superficial veins of right lower extremities: Secondary | ICD-10-CM | POA: Diagnosis not present

## 2014-07-06 DIAGNOSIS — I8001 Phlebitis and thrombophlebitis of superficial vessels of right lower extremity: Secondary | ICD-10-CM | POA: Diagnosis not present

## 2014-07-06 DIAGNOSIS — M7989 Other specified soft tissue disorders: Secondary | ICD-10-CM | POA: Diagnosis present

## 2014-07-06 LAB — CBC WITH DIFFERENTIAL/PLATELET
Basophils Absolute: 0 10*3/uL (ref 0–0.1)
Basophils Relative: 1 %
Eosinophils Absolute: 0.4 10*3/uL (ref 0–0.7)
Eosinophils Relative: 8 %
HCT: 43.8 % (ref 40.0–52.0)
Hemoglobin: 14.5 g/dL (ref 13.0–18.0)
Lymphocytes Relative: 36 %
Lymphs Abs: 1.9 10*3/uL (ref 1.0–3.6)
MCH: 27.3 pg (ref 26.0–34.0)
MCHC: 33 g/dL (ref 32.0–36.0)
MCV: 82.8 fL (ref 80.0–100.0)
Monocytes Absolute: 0.5 10*3/uL (ref 0.2–1.0)
Monocytes Relative: 10 %
Neutro Abs: 2.4 10*3/uL (ref 1.4–6.5)
Neutrophils Relative %: 45 %
Platelets: 189 10*3/uL (ref 150–440)
RBC: 5.29 MIL/uL (ref 4.40–5.90)
RDW: 14.8 % — ABNORMAL HIGH (ref 11.5–14.5)
WBC: 5.3 10*3/uL (ref 3.8–10.6)

## 2014-07-06 LAB — PROTIME-INR
INR: 1.18
Prothrombin Time: 15 seconds (ref 11.4–15.0)

## 2014-07-06 NOTE — ED Provider Notes (Signed)
CSN: [id number]     Arrival date & time 07/06/14  1640 History   First MD Initiated Contact with Patient 07/06/14 1701     Chief Complaint  Patient presents with  . Leg Swelling   (Consider location/radiation/quality/duration/timing/severity/associated sxs/prior Treatment) HPI  This is a 49 year old male who 2 days ago awoke with an area of his right mid anterior that was read warm tender and swollen. It is worsened over the next day. He denies any injury. States it only hurts "a little bit" to ambulate or stand on it. He is on warfarin for a previous pulmonary embolism. His last INR  was 3 weeks ago that he does not remember the value only that they told him everything was okay. He denies any shortness of breath  Past Medical History  Diagnosis Date  . Pulmonary embolism 2004  . Diverticulitis   . Sarcoidosis    Past Surgical History  Procedure Laterality Date  . Abdominal hernia repair  2014  . Colectomy  2004  . Colostomy revision  2004   Family History  Problem Relation Age of Onset  . Stroke Father    History  Substance Use Topics  . Smoking status: Current Some Day Smoker    Types: Cigarettes  . Smokeless tobacco: Not on file  . Alcohol Use: Yes     Comment: socially    Review of Systems  Constitutional: Negative for fever and fatigue.  All other systems reviewed and are negative.   Allergies  Review of patient's allergies indicates no known allergies.  Home Medications   Prior to Admission medications   Medication Sig Start Date End Date Taking? Authorizing Provider  hydrochlorothiazide (HYDRODIURIL) 25 MG tablet Take 25 mg by mouth daily.   Yes Historical Provider, MD  warfarin (COUMADIN) 1 MG tablet Take 9 mg by mouth daily.   Yes Historical Provider, MD  ciprofloxacin (CIPRO) 500 MG tablet Take 1 tablet (500 mg total) by mouth 2 (two) times daily. 05/26/14   Jarold Song Betancourt, NP   BP 126/78 mmHg  Pulse 78  Temp(Src) 98.4 F (36.9 C) (Tympanic)  Resp  16  Ht 6' (1.829 m)  Wt 245 lb (111.131 kg)  BMI 33.22 kg/m2  SpO2 99% Physical Exam  Constitutional: He is oriented to person, place, and time. He appears well-developed and well-nourished.  HENT:  Head: Normocephalic and atraumatic.  Eyes: EOM are normal. Pupils are equal, round, and reactive to light.  Musculoskeletal:  Examination of the right lower extremity shows mild swelling of the anterior mid tibia with swelling that extends medially into the calf. There is warmth and mild erythema but tenderness to palpation. It is mildly indurated but not actually fluctuant. The calf itself is not tender or warm or swollen. There is little extension laterally beyond the tibia. There is no break in the skin or puncture wound.  Neurological: He is alert and oriented to person, place, and time.  Skin: Skin is warm and dry.  See musculoskeletal  Psychiatric: He has a normal mood and affect. His behavior is normal. Judgment and thought content normal.  Nursing note and vitals reviewed.   ED Course  Procedures (including critical care time) Labs Review Labs Reviewed  CBC WITH DIFFERENTIAL/PLATELET - Abnormal; Notable for the following:    RDW 14.8 (*)    All other components within normal limits  PROTIME-INR    Imaging Review Dg Tibia/fibula Right  07/06/2014   CLINICAL DATA:  Right lower leg swelling for 2  days. No known injury. Initial encounter.  EXAM: RIGHT TIBIA AND FIBULA - 2 VIEW  COMPARISON:  None.  FINDINGS: There is no evidence of fracture or other focal bone lesions. Soft tissues are unremarkable.  IMPRESSION: Negative exam.   Electronically Signed   By: Drusilla Kanner M.D.   On: 07/06/2014 18:28     MDM   1. Phlebitis of leg, superficial, right   2. History of DVT (deep vein thrombosis)    I spoken with the patient told him that his INR is subtherapeutic today. He'll need to contact his physician that adjust his Coumadin dosage to notify them. He stated that they were  thinking of placing him on xeralto which may be a good time to make the switch Concerned that he may have a phlebitis superficially of the right lower extremity which accounts for the cord that I am feeling medially. We will arrange for a venous Doppler ultrasound tonight I spoken with the tach regarding ultrasound  the cord on anteromedial of tibia. The patient desires that he have this done tonight. Will go to [facility] ultrasound    Lutricia Feil, [place] 07/06/14 1935

## 2014-07-06 NOTE — Discharge Instructions (Signed)
Phlebitis °Phlebitis is soreness and swelling (inflammation) of a vein. This can occur in your arms, legs, or torso (trunk), as well as deeper inside your body. Phlebitis is usually not serious when it occurs close to the surface of the body. However, it can cause serious problems when it occurs in a vein deeper inside the body. °CAUSES  °Phlebitis can be triggered by various things, including:  °· Reduced blood flow through your veins. This can happen with: °¨ Bed rest over a long period. °¨ Long-distance travel. °¨ Injury. °¨ Surgery. °¨ Being overweight (obese) or pregnant. °· Having an IV tube put in the vein and getting certain medicines through the vein. °· Cancer and cancer treatment. °· Use of illegal drugs taken through the vein. °· Inflammatory diseases. °· Inherited (genetic) diseases that increase the risk of blood clots. °· Hormone therapy, such as birth control pills. °SIGNS AND SYMPTOMS  °· Red, tender, swollen, and painful area on your skin. Usually, the area will be long and narrow. °· Firmness along the center of the affected area. This can indicate that a blood clot has formed. °· Low-grade fever. °DIAGNOSIS  °A health care provider can usually diagnose phlebitis by examining the affected area and asking about your symptoms. To check for infection or blood clots, your health care provider may order blood tests or an ultrasound exam of the area. Blood tests and your family history may also indicate if you have an underlying genetic disease that causes blood clots. Occasionally, a piece of tissue is taken from the body (biopsy sample) if an unusual cause of phlebitis is suspected. °TREATMENT  °Treatment will vary depending on the severity of the condition and the area of the body affected. Treatment may include: °· Use of a warm compress or heating pad. °· Use of compression stockings or bandages. °· Anti-inflammatory medicines. °· Removal of any IV tube that may be causing the problem. °· Medicines  that kill germs (antibiotics) if an infection is present. °· Blood-thinning medicines if a blood clot is suspected or present. °· In rare cases, surgery may be needed to remove damaged sections of vein. °HOME CARE INSTRUCTIONS  °· Only take over-the-counter or prescription medicines as directed by your health care provider. Take all medicines exactly as prescribed. °· Raise (elevate) the affected area above the level of your heart as directed by your health care provider. °· Apply a warm compress or heating pad to the affected area as directed by your health care provider. Do not sleep with the heating pad. °· Use compression stockings or bandages as directed. These will speed healing and prevent the condition from coming back. °· If you are on blood thinners: °¨ Get follow-up blood tests as directed by your health care provider. °¨ Check with your health care provider before using any new medicines. °¨ Carry a medical alert card or wear your medical alert jewelry to show that you are on blood thinners. °· For phlebitis in the legs: °¨ Avoid prolonged standing or bed rest. °¨ Keep your legs moving. Raise your legs when sitting or lying. °· Do not smoke. °· Women, particularly those over the age of 35, should consider the risks and benefits of taking the contraceptive pill. This kind of hormone treatment can increase your risk for blood clots. °· Follow up with your health care provider as directed. °SEEK MEDICAL CARE IF:  °· You have unusual bruising or any bleeding problems. °· Your swelling or pain in the affected area   is not improving. °· You are on anti-inflammatory medicine, and you develop belly (abdominal) pain. °SEEK IMMEDIATE MEDICAL CARE IF:  °· You have a sudden onset of chest pain or difficulty breathing. °· You have a fever or persistent symptoms for more than 2-3 days. °· You have a fever and your symptoms suddenly get worse. °MAKE SURE YOU: °· Understand these instructions. °· Will watch your  condition. °· Will get help right away if you are not doing well or get worse. °Document Released: 12/24/2000 Document Revised: 10/20/2012 Document Reviewed: 09/06/2012 °ExitCare® Patient Information ©2015 [employer]. This information is not intended to replace advice given to you by your health care provider. Make sure you discuss any questions you have with your health care provider. ° °

## 2014-07-06 NOTE — ED Notes (Signed)
Woke Tuesday morning with an area right anterior shin-red and swelling. Denies any injury

## 2014-07-12 ENCOUNTER — Telehealth: Payer: Self-pay

## 2014-07-12 NOTE — ED Notes (Signed)
Pt called and states he is having difficulty getting an appointment with [facility], appt is July 7th. His has not contacted them since Friday.  Pt is currently taking Xaralto, applying heat. Writer will call [facility] and see if they can see him sooner.

## 2014-09-23 ENCOUNTER — Ambulatory Visit
Admission: EM | Admit: 2014-09-23 | Discharge: 2014-09-23 | Disposition: A | Discharge status: Nursing Home (Includes ICF) | Payer: Managed Care, Other (non HMO) | Attending: Family Medicine | Admitting: Family Medicine

## 2014-09-23 ENCOUNTER — Ambulatory Visit: Payer: Managed Care, Other (non HMO)

## 2014-09-23 DIAGNOSIS — R05 Cough: Secondary | ICD-10-CM | POA: Diagnosis not present

## 2014-09-23 DIAGNOSIS — J40 Bronchitis, not specified as acute or chronic: Secondary | ICD-10-CM

## 2014-09-23 DIAGNOSIS — R059 Cough, unspecified: Secondary | ICD-10-CM

## 2014-09-23 MED ORDER — HYDROCOD POLST-CPM POLST ER 10-8 MG/5ML PO SUER: 5.0000 mL | Freq: Two times a day (BID) | ORAL | Status: DC | PRN

## 2014-09-23 MED ORDER — AZITHROMYCIN 250 MG PO TABS: ORAL_TABLET | ORAL | Status: DC

## 2014-09-23 NOTE — ED Provider Notes (Signed)
CSN: [id number]     Arrival date & time 09/23/14  1229 History   First MD Initiated Contact with Patient 09/23/14 1309     Chief Complaint  Patient presents with  . Cough   patient states she's had a cough for 3 weeks now. States this seems that this letting up a little bit but his cough is still persistent.  He has a history of sarcoidosis and also PE. Last PE was several years ago.    (Consider location/radiation/quality/duration/timing/severity/associated sxs/prior Treatment) Patient is a 49 y.o. male presenting with cough. The history is provided by the patient. No language interpreter was used.  Cough Cough characteristics:  Productive Sputum characteristics:  White Severity:  Moderate Timing:  Constant Progression:  Waxing and waning Chronicity:  New Context: sick contacts and upper respiratory infection   Context: not animal exposure, not exposure to allergens, not fumes, not occupational exposure, not smoke exposure and not weather changes   Ineffective treatments:  None tried Associated symptoms: no chest pain, no chills, no fever and no shortness of breath     Past Medical History  Diagnosis Date  . Pulmonary embolism 2004  . Diverticulitis   . Sarcoidosis    Past Surgical History  Procedure Laterality Date  . Abdominal hernia repair  2014  . Colectomy  2004  . Colostomy revision  2004   Family History  Problem Relation Age of Onset  . Stroke Father    Social History  Substance Use Topics  . Smoking status: Current Some Day Smoker    Types: Cigarettes  . Smokeless tobacco: Never Used  . Alcohol Use: Yes     Comment: socially    Review of Systems  Constitutional: Negative for fever and chills.  Respiratory: Positive for cough. Negative for shortness of breath.   Cardiovascular: Negative for chest pain.    Allergies  Review of patient's allergies indicates no known allergies.  Home Medications   Prior to Admission medications   Medication Sig  Start Date End Date Taking? Authorizing Provider  Rivaroxaban (XARELTO) 15 MG TABS tablet Take 15 mg by mouth 2 (two) times daily with a meal.   Yes Historical Provider, MD  VALSARTAN PO Take by mouth.   Yes Historical Provider, MD  azithromycin (ZITHROMAX Z-PAK) 250 MG tablet Take 2 tablets first day and then 1 po a day for 4 days 09/23/14   Hassan Rowan, MD  chlorpheniramine-HYDROcodone [facility] PENNKINETIC ER) 10-8 MG/5ML SUER Take 5 mLs by mouth every 12 (twelve) hours as needed for cough. 09/23/14   Hassan Rowan, MD  ciprofloxacin (CIPRO) 500 MG tablet Take 1 tablet (500 mg total) by mouth 2 (two) times daily. 05/26/14   Barbaraann Barthel, NP  hydrochlorothiazide (HYDRODIURIL) 25 MG tablet Take 25 mg by mouth daily.    Historical Provider, MD  warfarin (COUMADIN) 1 MG tablet Take 9 mg by mouth daily.    Historical Provider, MD   Meds Ordered and Administered this Visit  Medications - No data to display  BP 126/93 mmHg  Pulse 80  Temp(Src) 97.7 F (36.5 C) (Tympanic)  Resp 16  Ht 6' (1.829 m)  Wt 255 lb (115.667 kg)  BMI 34.58 kg/m2  SpO2 99% No data found.   Physical Exam  Constitutional: He is oriented to person, place, and time. He appears well-developed and well-nourished.  HENT:  Head: Normocephalic.  Eyes: Pupils are equal, round, and reactive to light.  Neck: Normal range of motion. Neck supple. No thyromegaly present.  Cardiovascular: Normal rate, regular rhythm and normal heart sounds.   Pulmonary/Chest: Effort normal and breath sounds normal. No respiratory distress. He has no wheezes.  Abdominal: Soft.  Musculoskeletal: Normal range of motion.  Neurological: He is alert and oriented to person, place, and time.  Skin: Skin is warm.  Psychiatric: He has a normal mood and affect. His behavior is normal.  Vitals reviewed.   ED Course  Procedures (including critical care time)  Labs Review Labs Reviewed - No data to display  Imaging Review Dg Chest 2  View  09/23/2014   CLINICAL DATA:  Cough for 3 weeks. History of sarcoid and pulmonary embolism.  EXAM: CHEST  2 VIEW  COMPARISON:  02/09/2013  FINDINGS: Chronic borderline cardiomegaly. Stable mediastinal contours. No definitive hilar adenopathy.  Stable low lung volumes with scattered coarse interstitial opacities. No evidence of superimposed pneumonia or edema. No effusion or pneumothorax.  IMPRESSION: Interstitial lung disease/ pulmonary fibrosis without evidence of acute superimposed disease.   Electronically Signed   By: Marnee Spring M.D.   On: 09/23/2014 14:59     Visual Acuity Review  Right Eye Distance:   Left Eye Distance:   Bilateral Distance:    Right Eye Near:   Left Eye Near:    Bilateral Near:         MDM   1. Cough   2. Bronchitis      Chest x-rays negative will treat with Tussionex and Z-Pak and follow up with his PCP things not improve. Patient is not going back to work until Monday. Comparison with previous chest x-ray showed improvement of the previous interstitial lung disease a had he does have markedly raised right diaphragm.    Hassan Rowan, MD 09/23/14 [phone]

## 2014-09-23 NOTE — ED Notes (Signed)
Dry cough x 3 weeks. Associated sx include H/A. Pt denies other cold sx.

## 2014-09-23 NOTE — Discharge Instructions (Signed)
Cough, Adult ° A cough is a reflex. It helps you clear your throat and airways. A cough can help heal your body. A cough can last 2 or 3 weeks (acute) or may last more than 8 weeks (chronic). Some common causes of a cough can include an infection, allergy, or a cold. °HOME CARE °· Only take medicine as told by your doctor. °· If given, take your medicines (antibiotics) as told. Finish them even if you start to feel better. °· Use a cold steam vaporizer or humidifier in your home. This can help loosen thick spit (secretions). °· Sleep so you are almost sitting up (semi-upright). Use pillows to do this. This helps reduce coughing. °· Rest as needed. °· Stop smoking if you smoke. °GET HELP RIGHT AWAY IF: °· You have yellowish-white fluid (pus) in your thick spit. °· Your cough gets worse. °· Your medicine does not reduce coughing, and you are losing sleep. °· You cough up blood. °· You have trouble breathing. °· Your pain gets worse and medicine does not help. °· You have a fever. °MAKE SURE YOU:  °· Understand these instructions. °· Will watch your condition. °· Will get help right away if you are not doing well or get worse. °Document Released: 09/12/2010 Document Revised: 05/16/2013 Document Reviewed: 09/12/2010 °ExitCare® Patient Information ©2015 [employer]. This information is not intended to replace advice given to you by your health care provider. Make sure you discuss any questions you have with your health care provider. ° °Upper Respiratory Infection, Adult °An upper respiratory infection (URI) is also known as the common cold. It is often caused by a type of germ (virus). Colds are easily spread (contagious). You can pass it to others by kissing, coughing, sneezing, or drinking out of the same glass. Usually, you get better in 1 or 2 weeks.  °HOME CARE  °· Only take medicine as told by your doctor. °· Use a warm mist humidifier or breathe in steam from a hot shower. °· Drink enough water and fluids to  keep your pee (urine) clear or pale yellow. °· Get plenty of rest. °· Return to work when your temperature is back to normal or as told by your doctor. You may use a face mask and wash your hands to stop your cold from spreading. °GET HELP RIGHT AWAY IF:  °· After the first few days, you feel you are getting worse. °· You have questions about your medicine. °· You have chills, shortness of breath, or brown or red spit (mucus). °· You have yellow or brown snot (nasal discharge) or pain in the face, especially when you bend forward. °· You have a fever, puffy (swollen) neck, pain when you swallow, or white spots in the back of your throat. °· You have a bad headache, ear pain, sinus pain, or chest pain. °· You have a high-pitched whistling sound when you breathe in and out (wheezing). °· You have a lasting cough or cough up blood. °· You have sore muscles or a stiff neck. °MAKE SURE YOU:  °· Understand these instructions. °· Will watch your condition. °· Will get help right away if you are not doing well or get worse. °Document Released: 06/18/2007 Document Revised: 03/24/2011 Document Reviewed: 04/06/2013 °ExitCare® Patient Information ©2015 [employer]. This information is not intended to replace advice given to you by your health care provider. Make sure you discuss any questions you have with your health care provider. ° °

## 2015-08-12 IMAGING — CR DG TIBIA/FIBULA 2V*R*
4 series · 4 of 4 positions shown · non-contrast
Comparison: None.

CLINICAL DATA: Right lower leg swelling for 2 days. No known
injury. Initial encounter.

EXAM:
RIGHT TIBIA AND FIBULA - 2 VIEW

[tibia ap (1 of 2)]
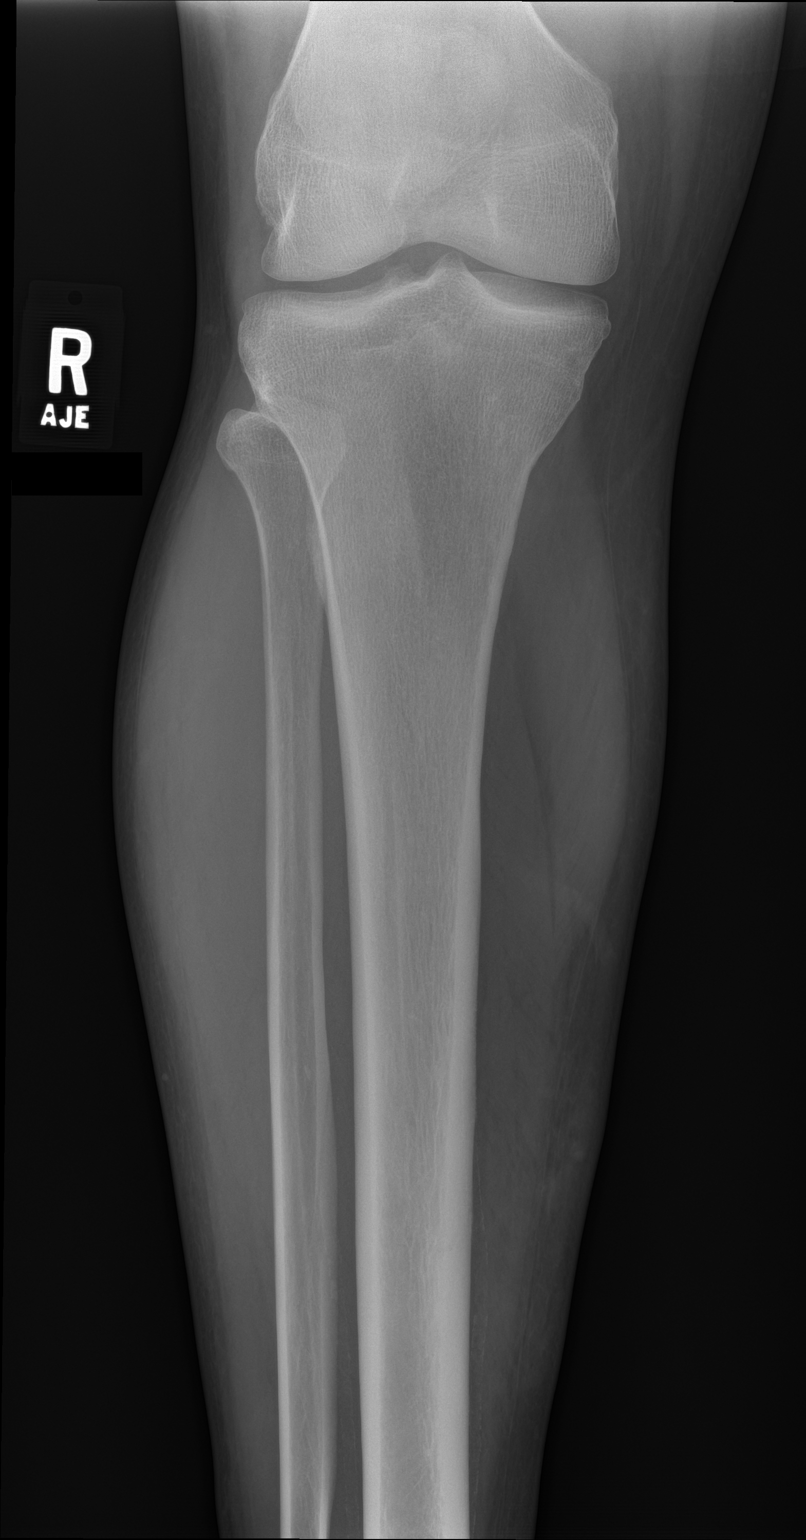

[tibia ap (2 of 2)]
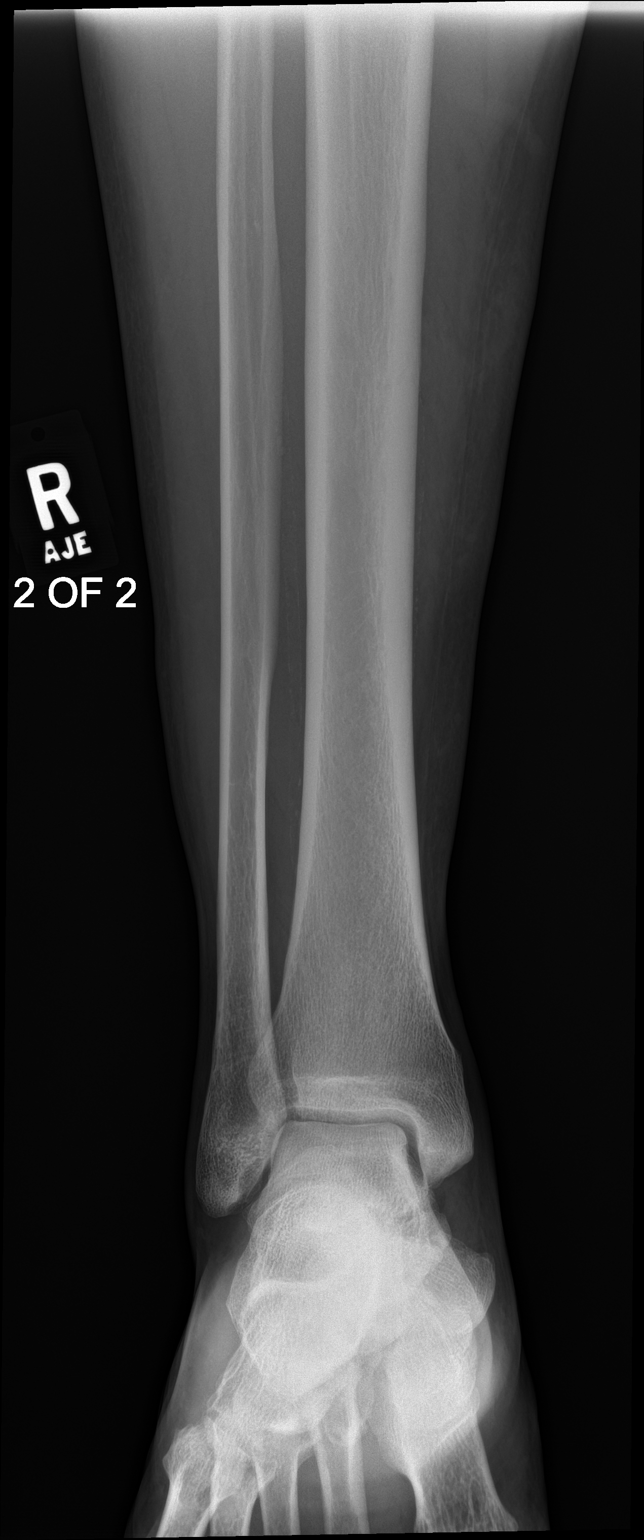

[tibia lat (1 of 2)]
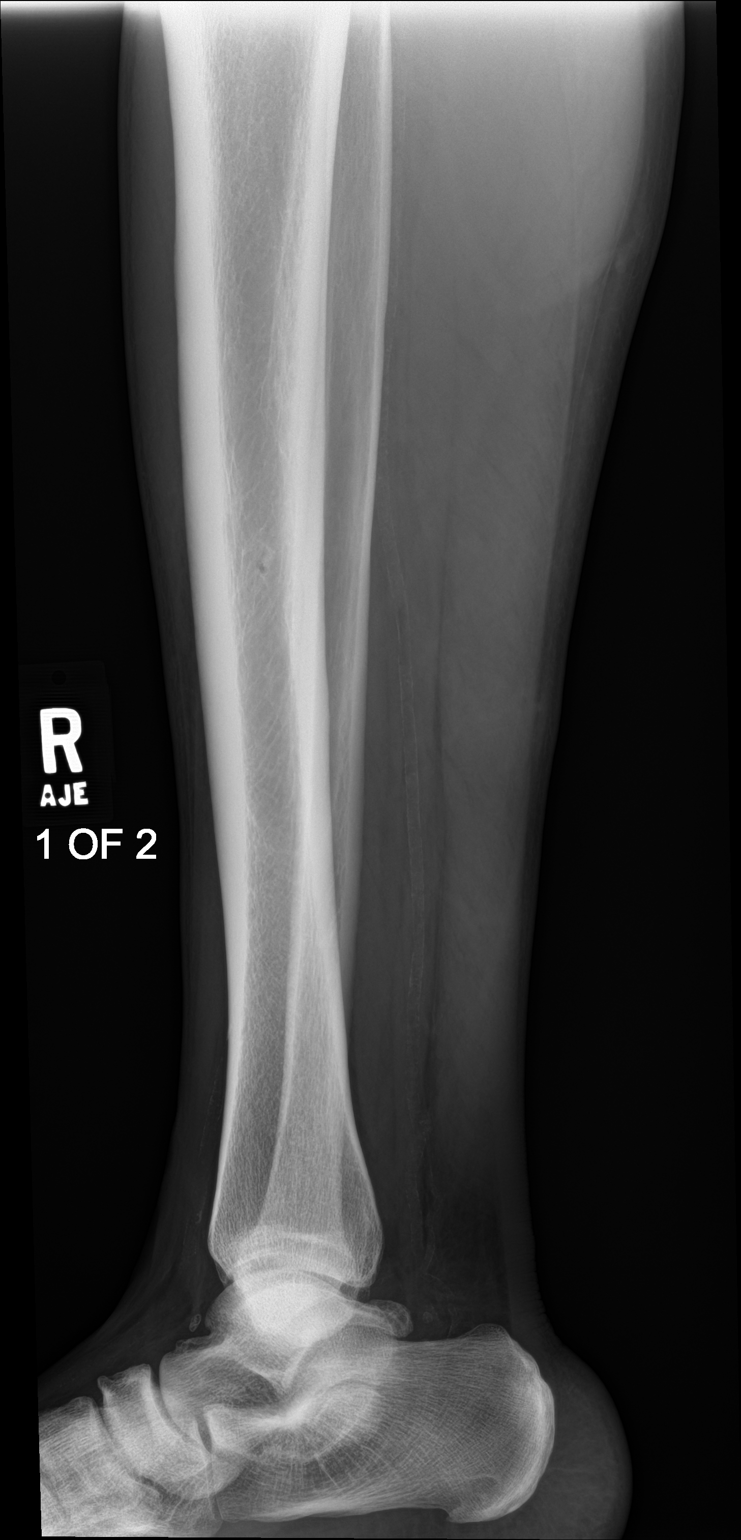

[tibia lat (2 of 2)]
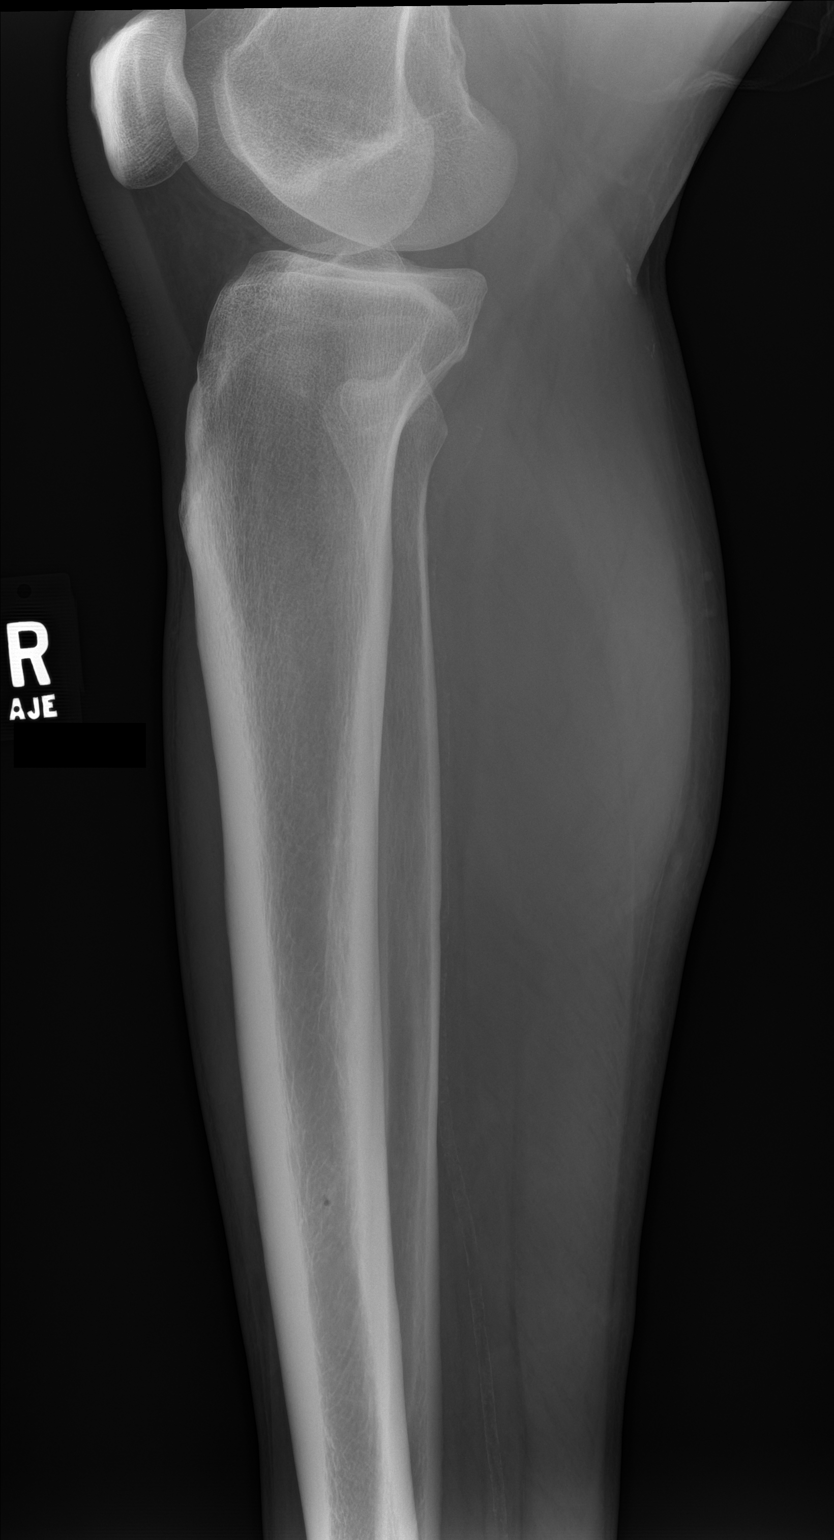

[4 of 4 positions shown; findings below may reference images not displayed]

FINDINGS: There is no evidence of fracture or other focal bone lesions. Soft
tissues are unremarkable.
IMPRESSION: Negative exam.

## 2015-10-13 ENCOUNTER — Ambulatory Visit
Admission: EM | Admit: 2015-10-13 | Discharge: 2015-10-13 | Disposition: A | Discharge status: Home / Self Care | Payer: Managed Care, Other (non HMO) | Attending: Family Medicine | Admitting: Family Medicine

## 2015-10-13 ENCOUNTER — Encounter: Payer: Self-pay | Admitting: *Deleted

## 2015-10-13 DIAGNOSIS — Z823 Family history of stroke: Secondary | ICD-10-CM | POA: Insufficient documentation

## 2015-10-13 DIAGNOSIS — Z7901 Long term (current) use of anticoagulants: Secondary | ICD-10-CM | POA: Insufficient documentation

## 2015-10-13 DIAGNOSIS — R079 Chest pain, unspecified: Secondary | ICD-10-CM | POA: Diagnosis not present

## 2015-10-13 DIAGNOSIS — Z79899 Other long term (current) drug therapy: Secondary | ICD-10-CM | POA: Diagnosis not present

## 2015-10-13 DIAGNOSIS — F17210 Nicotine dependence, cigarettes, uncomplicated: Secondary | ICD-10-CM | POA: Diagnosis not present

## 2015-10-13 DIAGNOSIS — D869 Sarcoidosis, unspecified: Secondary | ICD-10-CM | POA: Diagnosis not present

## 2015-10-13 DIAGNOSIS — Z86711 Personal history of pulmonary embolism: Secondary | ICD-10-CM | POA: Insufficient documentation

## 2015-10-13 DIAGNOSIS — J209 Acute bronchitis, unspecified: Secondary | ICD-10-CM | POA: Diagnosis not present

## 2015-10-13 LAB — FIBRIN DERIVATIVES D-DIMER (ARMC ONLY): Fibrin derivatives D-dimer (ARMC): 270.69 (ref 0–499)

## 2015-10-13 MED ORDER — AZITHROMYCIN 250 MG PO TABS: ORAL_TABLET | ORAL | 0 refills | Status: DC

## 2015-10-13 MED ORDER — HYDROCOD POLST-CPM POLST ER 10-8 MG/5ML PO SUER: 5.0000 mL | Freq: Two times a day (BID) | ORAL | 0 refills | Status: DC | PRN

## 2015-10-13 NOTE — ED Triage Notes (Signed)
Patient started having symptoms of chest congestion and cough 1 week ago. No symptoms of sore throat or nasal congestion reported.

## 2015-10-13 NOTE — ED Provider Notes (Signed)
CSN: [id number]     Arrival date & time 10/13/15  1141 History   First MD Initiated Contact with Patient 10/13/15 1227     Chief Complaint  Patient presents with  . Cough   (Consider location/radiation/quality/duration/timing/severity/associated sxs/prior Treatment) HPI  This a 50 year old male presents with a one-week history of chest congestion and cough as well as sternal pain. He states that the pain is mostly with coughing but when questioned further says that it happens when he is more active. He is sweating today but states that that's partly from his sarcoidosis- it isn't a new finding. The pain does not radiate and stays substernal. It is tender to palpation mainly over the manubrium His main presenting problem was that of a cough. He has no sinus problems and does not complain of sore throat. Not had a fever or chills. Other comorbidities include previous pulmonary embolism currently taking Xarelto hypertension and diverticulitis      Past Medical History:  Diagnosis Date  . Diverticulitis   . Pulmonary embolism (HCC) 2004  . Sarcoidosis [facility](HCC)    Past Surgical History:  Procedure Laterality Date  . ABDOMINAL HERNIA REPAIR  2014  . COLECTOMY  2004  . COLOSTOMY REVISION  2004   Family History  Problem Relation Age of Onset  . Stroke Father    Social History  Substance Use Topics  . Smoking status: Current Some Day Smoker    Types: Cigarettes  . Smokeless tobacco: Never Used  . Alcohol use Yes     Comment: socially    Review of Systems  Constitutional: Positive for activity change and diaphoresis. Negative for appetite change, chills, fatigue and fever.  Respiratory: Positive for cough and chest tightness.   Cardiovascular: Positive for chest pain.  All other systems reviewed and are negative.   Allergies  Review of patient's allergies indicates no known allergies.  Home Medications   Prior to Admission medications   Medication Sig Start Date End Date  Taking? Authorizing Provider  albuterol (PROVENTIL HFA;VENTOLIN HFA) 108 (90 Base) MCG/ACT inhaler Inhale into the lungs every 6 (six) hours as needed for wheezing or shortness of breath.   Yes Historical Provider, MD  Rivaroxaban (XARELTO) 15 MG TABS tablet Take 25 mg by mouth daily with supper.    Yes Historical Provider, MD  VALSARTAN PO Take 20 mg by mouth daily.    Yes Historical Provider, MD  azithromycin (ZITHROMAX Z-PAK) 250 MG tablet Take 2 tablets first day and then 1 po a day for 4 days 10/13/15   Lutricia FeilWilliam P Saundra Gin, PA-C  chlorpheniramine-HYDROcodone (TUSSIONEX PENNKINETIC ER) 10-8 MG/5ML SUER Take 5 mLs by mouth every 12 (twelve) hours as needed for cough. 10/13/15   Lutricia FeilWilliam P Shemeka Wardle, PA-C   Meds Ordered and Administered this Visit  Medications - No data to display  BP (!) 143/86 (BP Location: Left Arm)   Pulse 85   Temp 98.2 F (36.8 C) (Oral)   Resp 16   Ht 6' (1.829 m)   Wt 260 lb (117.9 kg)   SpO2 98%   BMI 35.26 kg/m  No data found.   Physical Exam  Constitutional: He is oriented to person, place, and time. He appears well-developed and well-nourished. No distress.  HENT:  Head: Normocephalic and atraumatic.  Right Ear: External ear normal.  Left Ear: External ear normal.  Nose: Nose normal.  Mouth/Throat: Oropharynx is clear and moist. No oropharyngeal exudate.  Eyes: EOM are normal. Pupils are equal, round, and reactive to  light.  Neck: Normal range of motion. Neck supple.  Cardiovascular: Normal rate and normal heart sounds.  Exam reveals no gallop and no friction rub.   No murmur heard. Pulmonary/Chest: Effort normal and breath sounds normal. No respiratory distress. He has no wheezes. He has no rales. He exhibits tenderness.  Tenderness is localized over the manubrium which reproduces symptoms.  Musculoskeletal: Normal range of motion.  Neurological: He is alert and oriented to person, place, and time.  Skin: Skin is warm and dry. He is not diaphoretic.   Psychiatric: He has a normal mood and affect. His behavior is normal. Judgment and thought content normal.  Nursing note and vitals reviewed.   Urgent Care Course   Clinical Course     Procedures (including critical care time)  Labs Review Labs Reviewed  FIBRIN DERIVATIVES D-DIMER [facility] ONLY)    Imaging Review No results found.   Visual Acuity Review  Right Eye Distance:   Left Eye Distance:   Bilateral Distance:    Right Eye Near:   Left Eye Near:    Bilateral Near:     ED ECG REPORT   Date: 10/13/2015  EKG Time: 2:28 PM  Rate: 79  Rhythm: normal sinus rhythm,  unchanged from previous tracings. Occasional PVCs  Axis:Normal  Intervals:none  ST&T Change: None  Narrative Interpretation: Sinus rhythm with occasional PVCs. No acute changes are seen            MDM   1. Acute bronchitis, unspecified organism   2. Sarcoidosis [facility])    Current Discharge Medication List    Plan: 1. Test/x-ray results and diagnosis reviewed with patient 2. rx as per orders; risks, benefits, potential side effects reviewed with patient 3. Recommend supportive treatment with Cessation of smoking. Since the patient has  sarcoidosis and has been treated in the past with antibiotics and cough syrup start him on a Z-Pak which has worked for him in the past and provide him with Tussionex to help with his coughing at nighttime. Recommended that he follow-up with his primary care physician. D-dimer was negative today and EKG showed no acute changes. However if he has increased chest pain or noticed any changes he should go to the emergency room for evaluation. 4. F/u prn if symptoms worsen or don't improve     Lutricia Feil, PA-C 10/13/15 1428

## 2016-08-20 ENCOUNTER — Emergency Department
Admission: EM | Admit: 2016-08-20 | Discharge: 2016-08-20 | Disposition: A | Discharge status: Home / Self Care | Payer: Commercial Managed Care - PPO | Source: Intra-hospital | Admitting: Emergency Medicine

## 2016-08-20 ENCOUNTER — Emergency Department
Admission: EM | Admit: 2016-08-20 | Discharge: 2016-08-20 | Disposition: A | Discharge status: Home / Self Care | Payer: Commercial Managed Care - PPO | Source: Intra-hospital

## 2016-08-20 DIAGNOSIS — R51 Headache: Principal | ICD-10-CM

## 2016-08-20 DIAGNOSIS — R0602 Shortness of breath: Secondary | ICD-10-CM

## 2016-08-20 MED ORDER — METFORMIN 500 MG TABLET
ORAL_TABLET | Freq: Two times a day (BID) | ORAL | 0 refills | 0.00000 days | Status: CP
Start: 2016-08-20 — End: 2016-11-30

## 2016-08-20 MED ORDER — LANCETS
1 refills | 0 days | Status: CP
Start: 2016-08-20 — End: ?

## 2016-08-20 MED ORDER — BLOOD-GLUCOSE METER
0 refills | 0 days | Status: CP
Start: 2016-08-20 — End: ?

## 2016-08-20 MED ORDER — ACCU-CHEK AVIVA STRIPS
1 refills | 0 days | Status: CP
Start: 2016-08-20 — End: ?

## 2016-08-27 ENCOUNTER — Encounter: Payer: Self-pay | Admitting: Emergency Medicine

## 2016-08-27 ENCOUNTER — Ambulatory Visit
Admission: EM | Admit: 2016-08-27 | Discharge: 2016-08-27 | Disposition: A | Discharge status: Home / Self Care | Payer: Commercial Managed Care - PPO | Attending: Family Medicine | Admitting: Family Medicine

## 2016-08-27 DIAGNOSIS — R739 Hyperglycemia, unspecified: Secondary | ICD-10-CM | POA: Diagnosis not present

## 2016-08-27 DIAGNOSIS — R7309 Other abnormal glucose: Secondary | ICD-10-CM

## 2016-08-27 HISTORY — DX: Type 2 diabetes mellitus without complications: E11.9

## 2016-08-27 LAB — GLUCOSE, CAPILLARY: Glucose-Capillary: 389 mg/dL — ABNORMAL HIGH (ref 65–99)

## 2016-08-27 MED ORDER — GLIPIZIDE 5 MG PO TABS: 5.0000 mg | ORAL_TABLET | Freq: Every day | ORAL | 0 refills | Status: DC

## 2016-08-27 NOTE — ED Triage Notes (Signed)
Patient here because his blood sugar has been elevated.  Patient states his blood sugar today when he checked it was 448.

## 2016-09-22 ENCOUNTER — Ambulatory Visit: Payer: Managed Care, Other (non HMO) | Admitting: *Deleted

## 2016-10-14 ENCOUNTER — Ambulatory Visit
Admission: RE | Admit: 2016-10-14 | Discharge: 2016-10-14 | Disposition: A | Discharge status: Home / Self Care | Payer: Commercial Managed Care - PPO

## 2016-10-14 DIAGNOSIS — D869 Sarcoidosis, unspecified: Secondary | ICD-10-CM

## 2016-10-14 DIAGNOSIS — G4733 Obstructive sleep apnea (adult) (pediatric): Secondary | ICD-10-CM

## 2016-10-14 DIAGNOSIS — Z720 Tobacco use: Principal | ICD-10-CM

## 2016-10-14 DIAGNOSIS — I2699 Other pulmonary embolism without acute cor pulmonale: Secondary | ICD-10-CM

## 2016-10-21 ENCOUNTER — Ambulatory Visit
Admission: RE | Admit: 2016-10-21 | Discharge: 2016-10-21 | Disposition: A | Discharge status: Home / Self Care | Payer: Commercial Managed Care - PPO | Attending: Surgery | Admitting: Surgery

## 2016-10-21 ENCOUNTER — Ambulatory Visit
Admission: RE | Admit: 2016-10-21 | Discharge: 2016-10-21 | Disposition: A | Discharge status: Home / Self Care | Payer: Commercial Managed Care - PPO

## 2016-10-21 DIAGNOSIS — K439 Ventral hernia without obstruction or gangrene: Principal | ICD-10-CM

## 2016-10-21 NOTE — ED Provider Notes (Signed)
[facility]    CSN: [id number] Arrival date & time: 08/27/16  1658     History   Chief Complaint Chief Complaint  Patient presents with  . Hyperglycemia    HPI David Robles is a 51 y.o. male.   51 yo male with a c/o high blood sugars, recently seen in ED but has not followed up with PCP. States he's taking metformin but blood sugars not controlled. Denies any fevers, chills, chest pains, shortness of breath.     Hyperglycemia    Past Medical History:  Diagnosis Date  . Diabetes mellitus without complication (HCC)   . Diverticulitis   . Pulmonary embolism (HCC) 2004  . Sarcoidosis     There are no active problems to display for this patient.   Past Surgical History:  Procedure Laterality Date  . ABDOMINAL HERNIA REPAIR  2014  . COLECTOMY  2004  . COLOSTOMY REVISION  2004       Home Medications    Prior to Admission medications   Medication Sig Start Date End Date Taking? Authorizing Provider  metFORMIN (GLUCOPHAGE) 850 MG tablet Take 850 mg by mouth 2 (two) times daily with a meal.   Yes [provider]  albuterol (PROVENTIL HFA;VENTOLIN HFA) 108 (90 Base) MCG/ACT inhaler Inhale into the lungs every 6 (six) hours as needed for wheezing or shortness of breath.    [provider]  glipiZIDE (GLUCOTROL) 5 MG tablet Take 1 tablet (5 mg total) by mouth daily before breakfast. 08/27/16   Payton Mccallum, MD  Rivaroxaban (XARELTO) 15 MG TABS tablet Take 25 mg by mouth daily with supper.     [provider]  VALSARTAN PO Take 20 mg by mouth daily.     [provider]    Family History Family History  Problem Relation Age of Onset  . Stroke Father     Social History Social History  Substance Use Topics  . Smoking status: Current Some Day Smoker    Types: Cigarettes  . Smokeless tobacco: Never Used  . Alcohol use Yes     Comment: socially     Allergies   Patient has no known allergies.   Review of  Systems Review of Systems   Physical Exam Triage Vital Signs ED Triage Vitals [08/27/16 1721]  Enc Vitals Group     BP 134/80     Pulse Rate 85     Resp 16     Temp 98.4 F (36.9 C)     Temp Source Oral     SpO2 99 %     Weight 250 lb (113.4 kg)     Height  (1.854 m)     Head Circumference      Peak Flow      Pain Score 0     Pain Loc      Pain Edu?      Excl. in GC?    No data found.   Updated Vital Signs BP 134/80 (BP Location: Left Arm)   Pulse 85   Temp 98.4 F (36.9 C) (Oral)   Resp 16   Ht  (1.854 m)   Wt 250 lb (113.4 kg)   SpO2 99%   BMI 32.98 kg/m   Visual Acuity Right Eye Distance:   Left Eye Distance:   Bilateral Distance:    Right Eye Near:   Left Eye Near:    Bilateral Near:     Physical Exam  Constitutional: He appears  well-developed and well-nourished. No distress.  HENT:  Head: Normocephalic and atraumatic.  Mouth/Throat: Uvula is midline, oropharynx is clear and moist and mucous membranes are normal. No oropharyngeal exudate or tonsillar abscesses.  Neck: Normal range of motion. Neck supple. No tracheal deviation present. No thyromegaly present.  Cardiovascular: Normal rate, regular rhythm and normal heart sounds.   Pulmonary/Chest: Effort normal and breath sounds normal. No stridor. No respiratory distress. He has no wheezes. He has no rales. He exhibits no tenderness.  Lymphadenopathy:    He has no cervical adenopathy.  Neurological: He is alert.  Skin: Skin is warm and dry. No rash noted. He is not diaphoretic.  Nursing note and vitals reviewed.    UC Treatments / Results  Labs (all labs ordered are listed, but only abnormal results are displayed) Labs Reviewed  GLUCOSE, CAPILLARY - Abnormal; Notable for the following:       Result Value   Glucose-Capillary 389 (*)    All other components within normal limits  CBG MONITORING, ED    EKG  EKG Interpretation None       Radiology No results  found.  Procedures Procedures (including critical care time)  Medications Ordered in UC Medications - No data to display   Initial Impression / Assessment and Plan / UC Course  I have reviewed the triage vital signs and the nursing notes.  Pertinent labs & imaging results that were available during my care of the patient were reviewed by me and considered in my medical decision making (see chart for details).      Final Clinical Impressions(s) / UC Diagnoses   Final diagnoses:  Hyperglycemia    New Prescriptions Discharge Medication List as of 08/27/2016  5:56 PM    START taking these medications   Details  glipiZIDE (GLUCOTROL) 5 MG tablet Take 1 tablet (5 mg total) by mouth daily before breakfast., Starting Wed 08/27/2016, Normal       1. Lab results and diagnosis reviewed with patient 2. rx as per orders above; reviewed possible side effects, interactions, risks and benefits  3. Recommend supportive treatment with diet modification 4. Follow up with PCP 5. Follow-up prn if symptoms worsen or don't improve  Controlled Substance Prescriptions [place] Controlled Substance Registry consulted? Not Applicable   Payton Mccallum, MD 10/21/16 [phone]

## 2016-10-22 ENCOUNTER — Ambulatory Visit
Admission: RE | Admit: 2016-10-22 | Discharge: 2016-10-22 | Disposition: A | Discharge status: Home / Self Care | Payer: Commercial Managed Care - PPO

## 2016-10-22 DIAGNOSIS — K439 Ventral hernia without obstruction or gangrene: Principal | ICD-10-CM

## 2016-11-11 ENCOUNTER — Ambulatory Visit
Admission: RE | Admit: 2016-11-11 | Discharge: 2016-11-11 | Payer: Commercial Managed Care - PPO | Attending: Surgery | Admitting: Surgery

## 2016-11-11 DIAGNOSIS — K432 Incisional hernia without obstruction or gangrene: Principal | ICD-10-CM

## 2016-11-21 ENCOUNTER — Ambulatory Visit: Admission: RE | Admit: 2016-11-21 | Discharge: 2016-11-21

## 2016-11-21 DIAGNOSIS — G4733 Obstructive sleep apnea (adult) (pediatric): Secondary | ICD-10-CM

## 2016-11-21 DIAGNOSIS — E119 Type 2 diabetes mellitus without complications: Secondary | ICD-10-CM

## 2016-11-21 DIAGNOSIS — Z01818 Encounter for other preprocedural examination: Principal | ICD-10-CM

## 2016-11-21 DIAGNOSIS — I10 Essential (primary) hypertension: Secondary | ICD-10-CM

## 2016-11-27 DIAGNOSIS — K430 Incisional hernia with obstruction, without gangrene: Principal | ICD-10-CM

## 2016-11-28 ENCOUNTER — Inpatient Hospital Stay
Admission: RE | Admit: 2016-11-28 | Discharge: 2016-11-30 | Disposition: A | Discharge status: Home / Self Care | Payer: Commercial Managed Care - PPO | Attending: Certified Registered" | Admitting: Certified Registered"

## 2016-11-28 ENCOUNTER — Inpatient Hospital Stay
Admission: RE | Admit: 2016-11-28 | Discharge: 2016-11-30 | Disposition: A | Discharge status: Home / Self Care | Payer: Commercial Managed Care - PPO | Attending: Surgery | Admitting: Surgery

## 2016-11-28 DIAGNOSIS — K430 Incisional hernia with obstruction, without gangrene: Principal | ICD-10-CM

## 2016-11-30 MED ORDER — POLYETHYLENE GLYCOL 3350 17 GRAM ORAL POWDER PACKET
PACK | Freq: Every day | ORAL | 0 refills | 0 days | Status: CP
Start: 2016-11-30 — End: 2016-12-03

## 2016-11-30 MED ORDER — OXYCODONE 5 MG TABLET
ORAL_TABLET | ORAL | 0 refills | 0.00000 days | Status: CP | PRN
Start: 2016-11-30 — End: 2016-12-07

## 2016-11-30 MED ORDER — DOCUSATE SODIUM 100 MG CAPSULE
ORAL_CAPSULE | Freq: Two times a day (BID) | ORAL | 0 refills | 0 days | Status: CP
Start: 2016-11-30 — End: 2016-12-30

## 2016-11-30 MED ORDER — ACETAMINOPHEN 325 MG TABLET
Freq: Four times a day (QID) | ORAL | 0 refills | 0.00000 days | PRN
Start: 2016-11-30 — End: ?

## 2016-11-30 MED ORDER — ONDANSETRON 4 MG DISINTEGRATING TABLET
ORAL_TABLET | Freq: Three times a day (TID) | ORAL | 0 refills | 0 days | Status: CP | PRN
Start: 2016-11-30 — End: 2016-12-07

## 2016-12-09 ENCOUNTER — Ambulatory Visit
Admission: RE | Admit: 2016-12-09 | Discharge: 2016-12-09 | Payer: Commercial Managed Care - PPO | Attending: Surgery | Admitting: Surgery

## 2016-12-09 DIAGNOSIS — Z8719 Personal history of other diseases of the digestive system: Secondary | ICD-10-CM

## 2016-12-09 DIAGNOSIS — Z9889 Other specified postprocedural states: Principal | ICD-10-CM

## 2017-01-12 ENCOUNTER — Ambulatory Visit
Admission: RE | Admit: 2017-01-12 | Discharge: 2017-01-12 | Payer: Commercial Managed Care - PPO | Attending: Surgery | Admitting: Surgery

## 2017-01-12 DIAGNOSIS — Z8719 Personal history of other diseases of the digestive system: Secondary | ICD-10-CM

## 2017-01-12 DIAGNOSIS — Z9889 Other specified postprocedural states: Principal | ICD-10-CM

## 2017-05-08 ENCOUNTER — Institutional Professional Consult (permissible substitution): Admit: 2017-05-08 | Discharge: 2017-05-08

## 2017-05-08 ENCOUNTER — Ambulatory Visit: Admit: 2017-05-08 | Discharge: 2017-05-08

## 2017-05-08 DIAGNOSIS — I2699 Other pulmonary embolism without acute cor pulmonale: Secondary | ICD-10-CM

## 2017-05-08 DIAGNOSIS — Z720 Tobacco use: Principal | ICD-10-CM

## 2017-05-08 DIAGNOSIS — D869 Sarcoidosis, unspecified: Secondary | ICD-10-CM

## 2017-05-08 DIAGNOSIS — I499 Cardiac arrhythmia, unspecified: Principal | ICD-10-CM

## 2017-05-08 DIAGNOSIS — R0600 Dyspnea, unspecified: Secondary | ICD-10-CM

## 2017-05-08 DIAGNOSIS — G4733 Obstructive sleep apnea (adult) (pediatric): Secondary | ICD-10-CM

## 2017-05-08 DIAGNOSIS — Z23 Encounter for immunization: Secondary | ICD-10-CM

## 2017-05-19 ENCOUNTER — Ambulatory Visit: Admit: 2017-05-19 | Discharge: 2017-05-19

## 2017-05-19 DIAGNOSIS — I499 Cardiac arrhythmia, unspecified: Principal | ICD-10-CM

## 2017-05-20 ENCOUNTER — Ambulatory Visit: Admit: 2017-05-20 | Discharge: 2017-05-21

## 2017-05-20 DIAGNOSIS — D869 Sarcoidosis, unspecified: Secondary | ICD-10-CM

## 2017-05-20 DIAGNOSIS — I499 Cardiac arrhythmia, unspecified: Principal | ICD-10-CM

## 2017-05-24 ENCOUNTER — Emergency Department
Admit: 2017-05-24 | Discharge: 2017-05-24 | Disposition: A | Discharge status: Home / Self Care | Payer: PRIVATE HEALTH INSURANCE | Attending: Emergency Medicine

## 2017-05-24 ENCOUNTER — Encounter
Admit: 2017-05-24 | Discharge: 2017-05-24 | Disposition: A | Discharge status: Home / Self Care | Payer: PRIVATE HEALTH INSURANCE | Attending: Emergency Medicine

## 2017-05-24 DIAGNOSIS — R0602 Shortness of breath: Principal | ICD-10-CM

## 2017-05-25 DIAGNOSIS — R0602 Shortness of breath: Principal | ICD-10-CM

## 2017-05-25 DIAGNOSIS — R001 Bradycardia, unspecified: Secondary | ICD-10-CM

## 2017-07-24 ENCOUNTER — Other Ambulatory Visit: Payer: Self-pay

## 2017-07-24 ENCOUNTER — Encounter: Payer: Self-pay | Admitting: Emergency Medicine

## 2017-07-24 ENCOUNTER — Ambulatory Visit
Admission: EM | Admit: 2017-07-24 | Discharge: 2017-07-24 | Disposition: A | Discharge status: Home / Self Care | Payer: Commercial Managed Care - PPO | Attending: Family Medicine | Admitting: Family Medicine

## 2017-07-24 DIAGNOSIS — G5603 Carpal tunnel syndrome, bilateral upper limbs: Secondary | ICD-10-CM

## 2017-07-24 MED ORDER — PREDNISONE 20 MG PO TABS: 20.0000 mg | ORAL_TABLET | Freq: Every day | ORAL | 0 refills | Status: DC

## 2017-07-24 NOTE — ED Provider Notes (Signed)
[facility]    CSN: [id number] Arrival date & time: 07/24/17  1212     History   Chief Complaint Chief Complaint  Patient presents with  . Hand Pain    bilateral     HPI David Robles is a 52 y.o. male.   52 yo male with a c/o intermittent bilateral hand pain and intermittent numbness/tingling for the past 2 months. Denies any injuries/trauma, swelling, rash.   The history is provided by the patient.    Past Medical History:  Diagnosis Date  . Diabetes mellitus without complication (HCC)   . Diverticulitis   . Pulmonary embolism (HCC) 2004  . Sarcoidosis     There are no active problems to display for this patient.   Past Surgical History:  Procedure Laterality Date  . ABDOMINAL HERNIA REPAIR  2014  . COLECTOMY  2004  . COLOSTOMY REVISION  2004       Home Medications    Prior to Admission medications   Medication Sig Start Date End Date Taking? Authorizing Provider  albuterol (PROVENTIL HFA;VENTOLIN HFA) 108 (90 Base) MCG/ACT inhaler Inhale into the lungs every 6 (six) hours as needed for wheezing or shortness of breath.   Yes [provider]  metFORMIN (GLUCOPHAGE) 850 MG tablet Take 850 mg by mouth 2 (two) times daily with a meal.   Yes [provider]  Rivaroxaban (XARELTO) 15 MG TABS tablet Take 25 mg by mouth daily with supper.    Yes [provider]  rosuvastatin (CRESTOR) 10 MG tablet Take by mouth. 02/11/17  Yes [provider]  candesartan (ATACAND) 8 MG tablet TAKE 1 TABLET BY MOUTH EVERY DAY IN THE MORNING 06/05/17   [provider]  glipiZIDE (GLUCOTROL) 5 MG tablet Take 1 tablet (5 mg total) by mouth daily before breakfast. 08/27/16   Payton Mccallum, MD  predniSONE (DELTASONE) 20 MG tablet Take 1 tablet (20 mg total) by mouth daily. 07/24/17   Payton Mccallum, MD  VALSARTAN PO Take 20 mg by mouth daily.     [provider]    Family History Family History  Problem Relation Age of  Onset  . Stroke Father     Social History Social History   Tobacco Use  . Smoking status: Current Some Day Smoker    Types: Cigarettes  . Smokeless tobacco: Never Used  Substance Use Topics  . Alcohol use: Yes    Comment: socially  . Drug use: Yes    Frequency: 2.0 times per week    Types: Marijuana     Allergies   Patient has no known allergies.   Review of Systems Review of Systems   Physical Exam Triage Vital Signs ED Triage Vitals  Enc Vitals Group     BP 07/24/17 1223 (!) 150/84     Pulse Rate 07/24/17 1223 (!) 58     Resp 07/24/17 1223 18     Temp 07/24/17 1223 98.5 F (36.9 C)     Temp Source 07/24/17 1223 Oral     SpO2 07/24/17 1223 100 %     Weight 07/24/17 1220 255 lb (115.7 kg)     Height 07/24/17 1220 6' (1.829 m)     Head Circumference --      Peak Flow --      Pain Score 07/24/17 1220 5     Pain Loc --      Pain Edu? --      Excl. in GC? --  No data found.  Updated Vital Signs BP (!) 150/84 (BP Location: Left Arm)   Pulse (!) 58   Temp 98.5 F (36.9 C) (Oral)   Resp 18   Ht 6' (1.829 m)   Wt 255 lb (115.7 kg)   SpO2 100%   BMI 34.58 kg/m   Visual Acuity Right Eye Distance:   Left Eye Distance:   Bilateral Distance:    Right Eye Near:   Left Eye Near:    Bilateral Near:     Physical Exam  Constitutional: He appears well-developed and well-nourished. No distress.  Musculoskeletal:       Right hand: Normal. Normal sensation noted. Normal strength noted.       Left hand: Normal. Normal sensation noted. Normal strength noted.  Neurological:  Positive Tinel's and Phalen's tests  Skin: He is not diaphoretic.  Nursing note and vitals reviewed.    UC Treatments / Results  Labs (all labs ordered are listed, but only abnormal results are displayed) Labs Reviewed - No data to display  EKG None  Radiology No results found.  Procedures Procedures (including critical care time)  Medications Ordered in UC Medications -  No data to display  Initial Impression / Assessment and Plan / UC Course  I have reviewed the triage vital signs and the nursing notes.  Pertinent labs & imaging results that were available during my care of the patient were reviewed by me and considered in my medical decision making (see chart for details).      Final Clinical Impressions(s) / UC Diagnoses   Final diagnoses:  Bilateral carpal tunnel syndrome   Discharge Instructions   None    ED Prescriptions    Medication Sig Dispense Auth. Provider   predniSONE (DELTASONE) 20 MG tablet Take 1 tablet (20 mg total) by mouth daily. 7 tablet Payton Mccallumonty, Neelie Welshans, MD     1. diagnosis reviewed with patient 2. rx as per orders above; reviewed possible side effects, interactions, risks and benefits  3. Recommend supportive treatment with  4. Follow-up prn if symptoms worsen or don't improve   Controlled Substance Prescriptions [place] Controlled Substance Registry consulted? Not Applicable   Payton Mccallumonty, Rachael Zapanta, MD 07/24/17 [phone]

## 2017-07-24 NOTE — ED Triage Notes (Signed)
Pt here c/o bilateral hand pain. He reports that it has been going on for a couple of months. When he wakes up int he mornings his hands are numb.

## 2017-07-28 ENCOUNTER — Emergency Department: Payer: Self-pay

## 2017-07-28 ENCOUNTER — Observation Stay
Admission: EM | Admit: 2017-07-28 | Discharge: 2017-07-29 | Disposition: A | Discharge status: Home / Self Care | Payer: Self-pay | Attending: Specialist | Admitting: Specialist

## 2017-07-28 ENCOUNTER — Encounter: Payer: Self-pay | Admitting: Emergency Medicine

## 2017-07-28 DIAGNOSIS — I10 Essential (primary) hypertension: Secondary | ICD-10-CM | POA: Insufficient documentation

## 2017-07-28 DIAGNOSIS — E119 Type 2 diabetes mellitus without complications: Secondary | ICD-10-CM

## 2017-07-28 DIAGNOSIS — Z86711 Personal history of pulmonary embolism: Secondary | ICD-10-CM | POA: Insufficient documentation

## 2017-07-28 DIAGNOSIS — Z7984 Long term (current) use of oral hypoglycemic drugs: Secondary | ICD-10-CM | POA: Insufficient documentation

## 2017-07-28 DIAGNOSIS — Z79899 Other long term (current) drug therapy: Secondary | ICD-10-CM | POA: Insufficient documentation

## 2017-07-28 DIAGNOSIS — R079 Chest pain, unspecified: Secondary | ICD-10-CM | POA: Diagnosis present

## 2017-07-28 DIAGNOSIS — D869 Sarcoidosis, unspecified: Secondary | ICD-10-CM | POA: Diagnosis present

## 2017-07-28 DIAGNOSIS — E785 Hyperlipidemia, unspecified: Secondary | ICD-10-CM | POA: Insufficient documentation

## 2017-07-28 DIAGNOSIS — Z7901 Long term (current) use of anticoagulants: Secondary | ICD-10-CM | POA: Insufficient documentation

## 2017-07-28 DIAGNOSIS — R0789 Other chest pain: Principal | ICD-10-CM | POA: Insufficient documentation

## 2017-07-28 DIAGNOSIS — F17210 Nicotine dependence, cigarettes, uncomplicated: Secondary | ICD-10-CM | POA: Insufficient documentation

## 2017-07-28 LAB — CBC
HCT: 44 % (ref 40.0–52.0)
Hemoglobin: 14.6 g/dL (ref 13.0–18.0)
MCH: 27.9 pg (ref 26.0–34.0)
MCHC: 33.2 g/dL (ref 32.0–36.0)
MCV: 83.9 fL (ref 80.0–100.0)
Platelets: 233 10*3/uL (ref 150–440)
RBC: 5.24 MIL/uL (ref 4.40–5.90)
RDW: 14.8 % — AB (ref 11.5–14.5)
WBC: 5.8 10*3/uL (ref 3.8–10.6)

## 2017-07-28 LAB — BASIC METABOLIC PANEL
Anion gap: 8 (ref 5–15)
BUN: 12 mg/dL (ref 6–20)
CHLORIDE: 105 mmol/L (ref 98–111)
CO2: 25 mmol/L (ref 22–32)
Calcium: 9.4 mg/dL (ref 8.9–10.3)
Creatinine, Ser: 1.22 mg/dL (ref 0.61–1.24)
GFR calc Af Amer: >60 mL/min (ref >60)
GFR calc non Af Amer: >60 mL/min (ref >60)
Glucose, Bld: 105 mg/dL — ABNORMAL HIGH (ref 70–99)
Potassium: 4 mmol/L (ref 3.5–5.1)
SODIUM: 138 mmol/L (ref 135–145)

## 2017-07-28 LAB — TROPONIN I: Troponin I: <0.03 ng/mL (ref <0.03)

## 2017-07-28 MED ORDER — ASPIRIN 81 MG PO CHEW
324.0000 mg | CHEWABLE_TABLET | Freq: Once | ORAL | Status: AC
  Administered 2017-07-28: 324 mg via ORAL
  Filled 2017-07-28: qty 4

## 2017-07-28 MED ORDER — RIVAROXABAN 20 MG PO TABS
20.0000 mg | ORAL_TABLET | ORAL | Status: AC
  Administered 2017-07-28: 20 mg via ORAL
  Filled 2017-07-28: qty 1

## 2017-07-28 NOTE — ED Provider Notes (Signed)
[facility] Emergency Department Provider Note  ____________________________________________  Time seen: Approximately 11:49 PM  I have reviewed the triage vital signs and the nursing notes.   HISTORY  Chief Complaint Shortness of Breath    HPI David Robles is a 52 y.o. male with a history of diabetes, hypertension, sarcoidosis, pulmonary embolism, on daily Xarelto who complains of intermittent chest pain and shortness of breath that started about 11:00 this morning.  Lasted 20 minutes at a time.  Nonradiating, chest pain is described as pressure.  Moderate intensity.   Not exertional today, but he does note that over the past week or 2 he has been getting chest pain with exertion.     Past Medical History:  Diagnosis Date  . Diabetes mellitus without complication (HCC)   . Diverticulitis   . Pulmonary embolism (HCC) 2004  . Sarcoidosis      There are no active problems to display for this patient.    Past Surgical History:  Procedure Laterality Date  . ABDOMINAL HERNIA REPAIR  2014  . COLECTOMY  2004  . COLOSTOMY REVISION  2004     Prior to Admission medications   Medication Sig Start Date End Date Taking? Authorizing Provider  albuterol (PROVENTIL HFA;VENTOLIN HFA) 108 (90 Base) MCG/ACT inhaler Inhale into the lungs every 6 (six) hours as needed for wheezing or shortness of breath.    [provider]  candesartan (ATACAND) 8 MG tablet TAKE 1 TABLET BY MOUTH EVERY DAY IN THE MORNING 06/05/17   [provider]  glipiZIDE (GLUCOTROL) 5 MG tablet Take 1 tablet (5 mg total) by mouth daily before breakfast. 08/27/16   Payton Mccallum, MD  metFORMIN (GLUCOPHAGE) 850 MG tablet Take 850 mg by mouth 2 (two) times daily with a meal.    [provider]  predniSONE (DELTASONE) 20 MG tablet Take 1 tablet (20 mg total) by mouth daily. 07/24/17   Payton Mccallum, MD  Rivaroxaban (XARELTO) 15 MG TABS tablet Take 25 mg by mouth daily  with supper.     [provider]  rosuvastatin (CRESTOR) 10 MG tablet Take by mouth. 02/11/17   [provider]  VALSARTAN PO Take 20 mg by mouth daily.     [provider]     Allergies Patient has no known allergies.   Family History  Problem Relation Age of Onset  . Stroke Father     Social History Social History   Tobacco Use  . Smoking status: Current Some Day Smoker    Types: Cigarettes  . Smokeless tobacco: Never Used  Substance Use Topics  . Alcohol use: Yes    Comment: socially  . Drug use: Yes    Frequency: 2.0 times per week    Types: Marijuana    Review of Systems  Constitutional:   No fever or chills.  ENT:   No sore throat. No rhinorrhea. Cardiovascular: Positive as above chest pain without syncope. Respiratory:   Positive shortness of breath without cough. Gastrointestinal:   Negative for abdominal pain, vomiting and diarrhea.  Musculoskeletal:   Negative for focal pain or swelling All other systems reviewed and are negative except as documented above in ROS and HPI.  ____________________________________________   PHYSICAL EXAM:  VITAL SIGNS: ED Triage Vitals [07/28/17 2109]  Enc Vitals Group     BP (!) 151/74     Pulse Rate (!) 39     Resp 17     Temp 97.8 F (36.6 C)  Temp Source Oral     SpO2 99 %     Weight 255 lb (115.7 kg)     Height      Head Circumference      Peak Flow      Pain Score      Pain Loc      Pain Edu?      Excl. in GC?     Vital signs reviewed, nursing assessments reviewed.   Constitutional:   Alert and oriented. Non-toxic appearance. Eyes:   Conjunctivae are normal. EOMI. PERRL. ENT      Head:   Normocephalic and atraumatic.      Nose:   No congestion/rhinnorhea.       Mouth/Throat:   MMM, no pharyngeal erythema. No peritonsillar mass.       Neck:   No meningismus. Full ROM. Hematological/Lymphatic/Immunilogical:   No cervical lymphadenopathy. Cardiovascular:   RRR. Symmetric  bilateral radial and DP pulses.  No murmurs. Cap refill less than 2 seconds.  Mechanical heart rate of about 80 with peripherally perfusing PVCs. Respiratory:   Normal respiratory effort without tachypnea/retractions. Breath sounds are clear and equal bilaterally. No wheezes/rales/rhonchi. Gastrointestinal:   Soft and nontender. Non distended. There is no CVA tenderness.  No rebound, rigidity, or guarding. Genitourinary:   deferred Musculoskeletal:   Normal range of motion in all extremities. No joint effusions.  No lower extremity tenderness.  No edema.  Chest wall nontender Neurologic:   Normal speech and language.  Motor grossly intact. No acute focal neurologic deficits are appreciated.  Skin:    Skin is warm, dry and intact. No rash noted.  No petechiae, purpura, or bullae.  ____________________________________________    LABS (pertinent positives/negatives) (all labs ordered are listed, but only abnormal results are displayed) Labs Reviewed  BASIC METABOLIC PANEL - Abnormal; Notable for the following components:      Result Value   Glucose, Bld 105 (*)    All other components within normal limits  CBC - Abnormal; Notable for the following components:   RDW 14.8 (*)    All other components within normal limits  TROPONIN I   ____________________________________________   EKG  Interpreted by me Sinus rhythm with frequent PVCs in a bigeminy pattern, mechanical rate of 81, normal axis, normal intervals.  Normal QRS ST segments and T waves.  ____________________________________________    RADIOLOGY  Dg Chest 2 View  Result Date: 07/28/2017 CLINICAL DATA:  Shortness of breath and nausea since this morning. EXAM: CHEST - 2 VIEW COMPARISON:  Chest radiograph September 23, 2014 FINDINGS: Cardiomediastinal silhouette is unremarkable for this low inspiratory examination with crowded vasculature markings. The lungs are clear without pleural effusions or focal consolidations. Chronic  interstitial changes. Trachea projects midline and there is no pneumothorax. Included soft tissue planes and osseous structures are non-suspicious. Old LEFT mid clavicle fracture. Spurring RIGHT distal clavicle. IMPRESSION: Chronic interstitial changes without focal consolidation. Electronically Signed   By: Awilda Metroourtnay  Bloomer M.D.   On: 07/28/2017 21:30    ____________________________________________   PROCEDURES Procedures  ____________________________________________    CLINICAL IMPRESSION / ASSESSMENT AND PLAN / ED COURSE  Pertinent labs & imaging results that were available during my care of the patient were reviewed by me and considered in my medical decision making (see chart for details).    Patient presents with chest pain with exertional component.  EKG nonischemic but with ectopy concerning for cardiac irritability.  He has risk factors for CAD including diabetes obesity sarcoidosis.  He  is already anticoagulated with Xarelto which he reports compliance with, but due to his high risk of give him full dose aspirin and discussed with hospitalist for further management.  Does not require nitro drip or heparin at this time, do not think this is unstable angina.  Doubt PE dissection or carditis.      ____________________________________________   FINAL CLINICAL IMPRESSION(S) / ED DIAGNOSES    Final diagnoses:  Exertional chest pain     ED Discharge Orders    None      Portions of this note were generated with dragon dictation software. Dictation errors may occur despite best attempts at proofreading.    Sharman Cheek, MD 07/28/17 [phone]

## 2017-07-28 NOTE — ED Triage Notes (Signed)
Pt c/o SOB and nausea since 1100 this morning. Pt has hx/o afib. Pt denies chest pain.

## 2017-07-28 NOTE — ED Notes (Signed)
Pt provided with water to drink

## 2017-07-29 ENCOUNTER — Observation Stay
Admit: 2017-07-29 | Discharge: 2017-07-29 | Disposition: A | Discharge status: Home / Self Care | Payer: Self-pay | Attending: Internal Medicine | Admitting: Internal Medicine

## 2017-07-29 ENCOUNTER — Other Ambulatory Visit: Payer: Self-pay

## 2017-07-29 DIAGNOSIS — R079 Chest pain, unspecified: Secondary | ICD-10-CM | POA: Diagnosis present

## 2017-07-29 DIAGNOSIS — D869 Sarcoidosis, unspecified: Secondary | ICD-10-CM | POA: Diagnosis present

## 2017-07-29 DIAGNOSIS — E119 Type 2 diabetes mellitus without complications: Secondary | ICD-10-CM

## 2017-07-29 LAB — CBC
HEMATOCRIT: 43.3 % (ref 40.0–52.0)
Hemoglobin: 14.6 g/dL (ref 13.0–18.0)
MCH: 28.2 pg (ref 26.0–34.0)
MCHC: 33.6 g/dL (ref 32.0–36.0)
MCV: 83.7 fL (ref 80.0–100.0)
Platelets: 217 10*3/uL (ref 150–440)
RBC: 5.17 MIL/uL (ref 4.40–5.90)
RDW: 14.6 % — AB (ref 11.5–14.5)
WBC: 5.5 10*3/uL (ref 3.8–10.6)

## 2017-07-29 LAB — BASIC METABOLIC PANEL
Anion gap: 5 (ref 5–15)
BUN: 11 mg/dL (ref 6–20)
CO2: 28 mmol/L (ref 22–32)
Calcium: 8.8 mg/dL — ABNORMAL LOW (ref 8.9–10.3)
Chloride: 104 mmol/L (ref 98–111)
Creatinine, Ser: 1.18 mg/dL (ref 0.61–1.24)
GFR calc Af Amer: >60 mL/min (ref >60)
GLUCOSE: 109 mg/dL — AB (ref 70–99)
POTASSIUM: 4 mmol/L (ref 3.5–5.1)
Sodium: 137 mmol/L (ref 135–145)

## 2017-07-29 LAB — TROPONIN I

## 2017-07-29 LAB — ECHOCARDIOGRAM COMPLETE
HEIGHTINCHES: 72 in
Weight: 4068.8 oz

## 2017-07-29 LAB — GLUCOSE, CAPILLARY
Glucose-Capillary: 93 mg/dL (ref 70–99)
Glucose-Capillary: 114 mg/dL — ABNORMAL HIGH (ref 70–99)

## 2017-07-29 MED ORDER — RIVAROXABAN 20 MG PO TABS: 20.0000 mg | ORAL_TABLET | Freq: Every day | ORAL | Status: DC

## 2017-07-29 MED ORDER — ACETAMINOPHEN 325 MG PO TABS: 650.0000 mg | ORAL_TABLET | Freq: Four times a day (QID) | ORAL | Status: DC | PRN

## 2017-07-29 MED ORDER — ACETAMINOPHEN 650 MG RE SUPP: 650.0000 mg | Freq: Four times a day (QID) | RECTAL | Status: DC | PRN

## 2017-07-29 MED ORDER — IRBESARTAN 150 MG PO TABS
75.0000 mg | ORAL_TABLET | Freq: Every day | ORAL | Status: DC
Start: 2017-07-29 — End: 2017-07-29
  Administered 2017-07-29: 75 mg via ORAL
  Filled 2017-07-29: qty 1

## 2017-07-29 MED ORDER — ONDANSETRON HCL 4 MG/2ML IJ SOLN: 4.0000 mg | Freq: Four times a day (QID) | INTRAMUSCULAR | Status: DC | PRN

## 2017-07-29 MED ORDER — ALBUTEROL SULFATE (2.5 MG/3ML) 0.083% IN NEBU: 2.5000 mg | INHALATION_SOLUTION | Freq: Four times a day (QID) | RESPIRATORY_TRACT | Status: DC | PRN

## 2017-07-29 MED ORDER — ONDANSETRON HCL 4 MG PO TABS: 4.0000 mg | ORAL_TABLET | Freq: Four times a day (QID) | ORAL | Status: DC | PRN

## 2017-07-29 MED ORDER — INSULIN ASPART 100 UNIT/ML ~~LOC~~ SOLN: 0.0000 [IU] | Freq: Four times a day (QID) | SUBCUTANEOUS | Status: DC

## 2017-07-29 MED ORDER — ROSUVASTATIN CALCIUM 10 MG PO TABS: 10.0000 mg | ORAL_TABLET | Freq: Every day | ORAL | Status: DC

## 2017-07-29 NOTE — H&P (Signed)
[facility] - [place] at [facility]   PATIENT NAME: David Robles    MR#:  [id number]  DATE OF BIRTH:  1965-04-13  DATE OF ADMISSION:  07/28/2017  PRIMARY CARE PHYSICIAN: Sherron Monday, MD   REQUESTING/REFERRING PHYSICIAN: Scotty Court, MD  CHIEF COMPLAINT:   Chief Complaint  Patient presents with  . Shortness of Breath    HISTORY OF PRESENT ILLNESS:  David Robles  is a 52 y.o. male who presents with chest pain associated with shortness of breath and nausea.  It lasted about an hour initially and happened around 10:00 the morning with the patient was pulling weeds in his yard.  It recurred intermittently throughout the day and he came to the ED for evaluation.  Initial work-up here is largely within normal limits, but hospitalist were called for admission for further evaluation.  PAST MEDICAL HISTORY:   Past Medical History:  Diagnosis Date  . Diabetes mellitus without complication (HCC)   . Diverticulitis   . Pulmonary embolism (HCC) 2004  . Sarcoidosis      PAST SURGICAL HISTORY:   Past Surgical History:  Procedure Laterality Date  . ABDOMINAL HERNIA REPAIR  2014  . COLECTOMY  2004  . COLOSTOMY REVISION  2004     SOCIAL HISTORY:   Social History   Tobacco Use  . Smoking status: Current Some Day Smoker    Types: Cigarettes  . Smokeless tobacco: Never Used  Substance Use Topics  . Alcohol use: Yes    Comment: socially     FAMILY HISTORY:   Family History  Problem Relation Age of Onset  . Stroke Father      DRUG ALLERGIES:  No Known Allergies  MEDICATIONS AT HOME:   Prior to Admission medications   Medication Sig Start Date End Date Taking? Authorizing Provider  albuterol (PROVENTIL HFA;VENTOLIN HFA) 108 (90 Base) MCG/ACT inhaler Inhale into the lungs every 6 (six) hours as needed for wheezing or shortness of breath.   Yes [provider]  candesartan (ATACAND) 8 MG tablet TAKE 1 TABLET BY MOUTH EVERY DAY IN THE  MORNING 06/05/17  Yes [provider]  metFORMIN (GLUCOPHAGE) 500 MG tablet Take 500 mg by mouth 2 (two) times daily with a meal.   Yes [provider]  rivaroxaban (XARELTO) 20 MG TABS tablet Take 20 mg by mouth daily with supper.   Yes [provider]  rosuvastatin (CRESTOR) 10 MG tablet Take 10 mg by mouth daily.  02/11/17  Yes [provider]  glipiZIDE (GLUCOTROL) 5 MG tablet Take 1 tablet (5 mg total) by mouth daily before breakfast. Patient not taking: Reported on 07/29/2017 08/27/16   Payton Mccallum, MD  predniSONE (DELTASONE) 20 MG tablet Take 1 tablet (20 mg total) by mouth daily. Patient not taking: Reported on 07/29/2017 07/24/17   Payton Mccallum, MD    REVIEW OF SYSTEMS:  Review of Systems  Constitutional: Negative for chills, fever, malaise/fatigue and weight loss.  HENT: Negative for ear pain, hearing loss and tinnitus.   Eyes: Negative for blurred vision, double vision, pain and redness.  Respiratory: Positive for shortness of breath. Negative for cough and hemoptysis.   Cardiovascular: Positive for chest pain. Negative for palpitations, orthopnea and leg swelling.  Gastrointestinal: Positive for nausea. Negative for abdominal pain, constipation, diarrhea and vomiting.  Genitourinary: Negative for dysuria, frequency and hematuria.  Musculoskeletal: Negative for back pain, joint pain and neck pain.  Skin:       No acne, rash, or  lesions  Neurological: Negative for dizziness, tremors, focal weakness and weakness.  Endo/Heme/Allergies: Negative for polydipsia. Does not bruise/bleed easily.  Psychiatric/Behavioral: Negative for depression. The patient is not nervous/anxious and does not have insomnia.      VITAL SIGNS:   Vitals:   07/28/17 2109 07/28/17 2230 07/28/17 2300  BP: (!) 151/74 138/85 140/90  Pulse: (!) 39 (!) 38 (!) 36  Resp: 17 18 (!) 25  Temp: 97.8 F (36.6 C)    TempSrc: Oral    SpO2: 99% 96% 97%  Weight: 115.7 kg (255 lb)      Wt Readings from Last 3 Encounters:  07/28/17 115.7 kg (255 lb)  07/24/17 115.7 kg (255 lb)  08/27/16 113.4 kg (250 lb)    PHYSICAL EXAMINATION:  Physical Exam  Vitals reviewed. Constitutional: He is oriented to person, place, and time. He appears well-developed and well-nourished. No distress.  HENT:  Head: Normocephalic and atraumatic.  Mouth/Throat: Oropharynx is clear and moist.  Eyes: Pupils are equal, round, and reactive to light. Conjunctivae and EOM are normal. No scleral icterus.  Neck: Normal range of motion. Neck supple. No JVD present. No thyromegaly present.  Cardiovascular: Normal rate, regular rhythm and intact distal pulses. Exam reveals no gallop and no friction rub.  No murmur heard. Respiratory: Effort normal and breath sounds normal. No respiratory distress. He has no wheezes. He has no rales.  GI: Soft. Bowel sounds are normal. He exhibits no distension. There is no tenderness.  Musculoskeletal: Normal range of motion. He exhibits no edema.  No arthritis, no gout  Lymphadenopathy:    He has no cervical adenopathy.  Neurological: He is alert and oriented to person, place, and time. No cranial nerve deficit.  No dysarthria, no aphasia  Skin: Skin is warm and dry. No rash noted. No erythema.  Psychiatric: He has a normal mood and affect. His behavior is normal. Judgment and thought content normal.    LABORATORY PANEL:   CBC Recent Labs  Lab 07/28/17 2139  WBC 5.8  HGB 14.6  HCT 44.0  PLT 233   ------------------------------------------------------------------------------------------------------------------  Chemistries  Recent Labs  Lab 07/28/17 2139  NA 138  K 4.0  CL 105  CO2 25  GLUCOSE 105*  BUN 12  CREATININE 1.22  CALCIUM 9.4   ------------------------------------------------------------------------------------------------------------------  Cardiac Enzymes Recent Labs  Lab 07/28/17 2139  TROPONINI <0.03    ------------------------------------------------------------------------------------------------------------------  RADIOLOGY:  Dg Chest 2 View  Result Date: 07/28/2017 CLINICAL DATA:  Shortness of breath and nausea since this morning. EXAM: CHEST - 2 VIEW COMPARISON:  Chest radiograph September 23, 2014 FINDINGS: Cardiomediastinal silhouette is unremarkable for this low inspiratory examination with crowded vasculature markings. The lungs are clear without pleural effusions or focal consolidations. Chronic interstitial changes. Trachea projects midline and there is no pneumothorax. Included soft tissue planes and osseous structures are non-suspicious. Old LEFT mid clavicle fracture. Spurring RIGHT distal clavicle. IMPRESSION: Chronic interstitial changes without focal consolidation. Electronically Signed   By: Awilda Metro M.D.   On: 07/28/2017 21:30    EKG:   Orders placed or performed during the hospital encounter of 07/28/17  . ED EKG within 10 minutes  . ED EKG within 10 minutes    IMPRESSION AND PLAN:  Principal Problem:   Chest pain -cycle cardiac enzymes, get an echocardiogram and a cardiology consult Active Problems:   Sarcoidosis -continue home medications   Diabetes (HCC) -sliding scale insulin with corresponding glucose checks  Chart review performed and case discussed with ED  provider. Labs, imaging and/or ECG reviewed by provider and discussed with patient/family. Management plans discussed with the patient and/or family.  DVT PROPHYLAXIS: SubQ lovenox  GI PROPHYLAXIS: None  ADMISSION STATUS: Observation  CODE STATUS: Full  TOTAL TIME TAKING CARE OF THIS PATIENT: 40 minutes.   Kerrington Greenhalgh FIELDING 07/29/2017, 12:56 AM  [facility]  Office  [phone]  CC: Primary care physician; Sherron Mondayejan-Sie, S Ahmed, MD  Note:  This document was prepared using Dragon voice recognition software and may include unintentional dictation errors.

## 2017-07-29 NOTE — Discharge Summary (Signed)
[facility] - [place] at [facility]   PATIENT NAME: David BoucheRicky Baysinger    MR#:  [id number]  DATE OF BIRTH:  07/07/1965  DATE OF ADMISSION:  07/28/2017 ADMITTING PHYSICIAN: Oralia Manisavid Willis, MD  DATE OF DISCHARGE: 07/29/2017 12:33 PM  PRIMARY CARE PHYSICIAN: Sherron Mondayejan-Sie, S Ahmed, MD    ADMISSION DIAGNOSIS:  Exertional chest pain [R07.9]  DISCHARGE DIAGNOSIS:  Principal Problem:   Chest pain Active Problems:   Sarcoidosis   Diabetes (HCC)   SECONDARY DIAGNOSIS:   Past Medical History:  Diagnosis Date  . Diabetes mellitus without complication (HCC)   . Diverticulitis   . Pulmonary embolism (HCC) 2004  . Sarcoidosis     HOSPITAL COURSE:   52 year old male with past medical history of sarcoidosis, diabetes, history of pulmonary embolism, diverticulitis who presented to the hospital due to chest pain.  1.  Chest pain- given patient's risk factors he was observed overnight on telemetry.  He had 3 sets of cardiac markers checked which were negative.  He was seen by cardiology and underwent a exercise treadmill stress test which was negative for acute ischemia without any EKG changes or reproduction of his symptoms. - He is now chest pain-free and hemodynamically stable and therefore being discharged home.  Patient's chest pain was likely related to his underlying sarcoid/musculoskeletal nature.  2.  History of previous pulmonary embolism-patient will continue with Xarelto.  3.  Essential hypertension-patient will continue his Atacand.  4.  Diabetes type 2 without complication-patient will continue his metformin.  5.  Hyperlipidemia-patient will resume his Crestor.  DISCHARGE CONDITIONS:   STable  CONSULTS OBTAINED:    DRUG ALLERGIES:  No Known Allergies  DISCHARGE MEDICATIONS:   Allergies as of 07/29/2017   No Known Allergies     Medication List    STOP taking these medications   glipiZIDE 5 MG tablet Commonly known as:  GLUCOTROL   predniSONE 20 MG  tablet Commonly known as:  DELTASONE     TAKE these medications   albuterol 108 (90 Base) MCG/ACT inhaler Commonly known as:  PROVENTIL HFA;VENTOLIN HFA Inhale into the lungs every 6 (six) hours as needed for wheezing or shortness of breath.   candesartan 8 MG tablet Commonly known as:  ATACAND TAKE 1 TABLET BY MOUTH EVERY DAY IN THE MORNING   metFORMIN 500 MG tablet Commonly known as:  GLUCOPHAGE Take 500 mg by mouth 2 (two) times daily with a meal.   rivaroxaban 20 MG Tabs tablet Commonly known as:  XARELTO Take 20 mg by mouth daily with supper.   rosuvastatin 10 MG tablet Commonly known as:  CRESTOR Take 10 mg by mouth daily.         DISCHARGE INSTRUCTIONS:   DIET:  Cardiac diet and Diabetic diet  DISCHARGE CONDITION:  Stable  ACTIVITY:  Activity as tolerated  OXYGEN:  Home Oxygen: No.   Oxygen Delivery: room air  DISCHARGE LOCATION:  home   If you experience worsening of your admission symptoms, develop shortness of breath, life threatening emergency, suicidal or homicidal thoughts you must seek medical attention immediately by calling 911 or calling your MD immediately  if symptoms less severe.  You Must read complete instructions/literature along with all the possible adverse reactions/side effects for all the Medicines you take and that have been prescribed to you. Take any new Medicines after you have completely understood and accpet all the possible adverse reactions/side effects.   Please note  You were cared for by a hospitalist during your hospital  stay. If you have any questions about your discharge medications or the care you received while you were in the hospital after you are discharged, you can call the unit and asked to speak with the hospitalist on call if the hospitalist that took care of you is not available. Once you are discharged, your primary care physician will handle any further medical issues. Please note that NO REFILLS for any  discharge medications will be authorized once you are discharged, as it is imperative that you return to your primary care physician (or establish a relationship with a primary care physician if you do not have one) for your aftercare needs so that they can reassess your need for medications and monitor your lab values.     Today   No chest pain presently.  Feels better. No other complaints. Will d/c home today  VITAL SIGNS:  Blood pressure 125/88, pulse (!) 37, temperature 98.2 F (36.8 C), temperature source Oral, resp. rate 16, height 6' (1.829 m), weight 115.3 kg (254 lb 4.8 oz), SpO2 96 %.  I/O:  No intake or output data in the 24 hours ending 07/29/17 1556  PHYSICAL EXAMINATION:  GENERAL:  52 y.o.-year-old obese patient lying in the bed with no acute distress.  EYES: Pupils equal, round, reactive to light and accommodation. No scleral icterus. Extraocular muscles intact.  HEENT: Head atraumatic, normocephalic. Oropharynx and nasopharynx clear.  NECK:  Supple, no jugular venous distention. No thyroid enlargement, no tenderness.  LUNGS: Normal breath sounds bilaterally, no wheezing, rales,rhonchi. No use of accessory muscles of respiration.  CARDIOVASCULAR: S1, S2 normal. No murmurs, rubs, or gallops.  ABDOMEN: Soft, non-tender, non-distended. Bowel sounds present. No organomegaly or mass.  EXTREMITIES: No pedal edema, cyanosis, or clubbing.  NEUROLOGIC: Cranial nerves II through XII are intact. No focal motor or sensory defecits b/l.  PSYCHIATRIC: The patient is alert and oriented x 3.  SKIN: No obvious rash, lesion, or ulcer.   DATA REVIEW:   CBC Recent Labs  Lab 07/29/17 0748  WBC 5.5  HGB 14.6  HCT 43.3  PLT 217    Chemistries  Recent Labs  Lab 07/29/17 0748  NA 137  K 4.0  CL 104  CO2 28  GLUCOSE 109*  BUN 11  CREATININE 1.18  CALCIUM 8.8*    Cardiac Enzymes Recent Labs  Lab 07/29/17 0748  TROPONINI <0.03    Microbiology Results  Results for  orders placed or performed during the hospital encounter of 05/26/14  Urine culture     Status: None   Collection Time: 05/26/14  5:13 PM  Result Value Ref Range Status   Specimen Description URINE, CLEAN CATCH  Final   Special Requests Normal  Final   Culture NO GROWTH 2 DAYS  Final   Report Status 05/28/2014 FINAL  Final    RADIOLOGY:  Dg Chest 2 View  Result Date: 07/28/2017 CLINICAL DATA:  Shortness of breath and nausea since this morning. EXAM: CHEST - 2 VIEW COMPARISON:  Chest radiograph September 23, 2014 FINDINGS: Cardiomediastinal silhouette is unremarkable for this low inspiratory examination with crowded vasculature markings. The lungs are clear without pleural effusions or focal consolidations. Chronic interstitial changes. Trachea projects midline and there is no pneumothorax. Included soft tissue planes and osseous structures are non-suspicious. Old LEFT mid clavicle fracture. Spurring RIGHT distal clavicle. IMPRESSION: Chronic interstitial changes without focal consolidation. Electronically Signed   By: Awilda Metro M.D.   On: 07/28/2017 21:30      Management plans discussed  with the patient, family and they are in agreement.  CODE STATUS:  Code Status History    Date Active Date Inactive Code Status Order ID Comments User Context   07/29/2017 0146 07/29/2017 1540 Full Code [id number]  Oralia Manis, MD Inpatient      TOTAL TIME TAKING CARE OF THIS PATIENT: 40 minutes.    Houston Siren M.D on 07/29/2017 at 3:56 PM  Between 7am to 6pm - Pager - [phone]  After 6pm go to [email or web address] - Social research officer, government  [facility] [place] [facility]  Office  [phone]  CC: Primary care physician; Sherron Monday, MD

## 2017-07-29 NOTE — Progress Notes (Signed)
No more chest pain  No evidence of ecg change or infarction Patient underwent a [place] protocol stress test with good exercise tolerance and no chest pain or ecg changes Patient at low risk OK for dc to home from cardiac standpoint if ambulationg well without further sx

## 2017-07-29 NOTE — Consult Note (Signed)
[facility] Cardiology Consultation Note  Patient ID: David Robles, MRN: [id number], DOB/AGE: [birth year] 52 y.o. Admit date: 07/28/2017   Date of Consult: 07/29/2017 Primary Physician: Sherron Monday, MD Primary Cardiologist: [facility]  Chief Complaint:  Chief Complaint  Patient presents with  . Shortness of Breath   Reason for Consult: Chest pain  HPI: 52 y.o. male with known apparent diabetes and hyperlipidemia who has had hypertension as well.  The patient has had minor family history of cardiovascular disease and has had a work-up for benign preventricular contractions.  He has had a previous echocardiogram with no apparent significant changes.  The patient now has had an episode of chest discomfort when he was out picking weeds and this chest discomfort was substernal in nature causing dizziness and shortness of breath.  After resting the patient had full resolution of the symptoms and was able to go to the emergency room.  At that time he had an EKG currently showing normal sinus rhythm with preventricular contractions and troponin has been normal.  Since then he has had no further episodes of chest discomfort  Past Medical History:  Diagnosis Date  . Diabetes mellitus without complication (HCC)   . Diverticulitis   . Pulmonary embolism (HCC) 2004  . Sarcoidosis       Surgical History:  Past Surgical History:  Procedure Laterality Date  . ABDOMINAL HERNIA REPAIR  2014  . COLECTOMY  2004  . COLOSTOMY REVISION  2004     Home Meds: Prior to Admission medications   Medication Sig Start Date End Date Taking? Authorizing Provider  albuterol (PROVENTIL HFA;VENTOLIN HFA) 108 (90 Base) MCG/ACT inhaler Inhale into the lungs every 6 (six) hours as needed for wheezing or shortness of breath.   Yes [provider]  candesartan (ATACAND) 8 MG tablet TAKE 1 TABLET BY MOUTH EVERY DAY IN THE MORNING 06/05/17  Yes [provider]  metFORMIN (GLUCOPHAGE) 500 MG tablet Take  500 mg by mouth 2 (two) times daily with a meal.   Yes [provider]  rivaroxaban (XARELTO) 20 MG TABS tablet Take 20 mg by mouth daily with supper.   Yes [provider]  rosuvastatin (CRESTOR) 10 MG tablet Take 10 mg by mouth daily.  02/11/17  Yes [provider]  glipiZIDE (GLUCOTROL) 5 MG tablet Take 1 tablet (5 mg total) by mouth daily before breakfast. Patient not taking: Reported on 07/29/2017 08/27/16   Payton Mccallum, MD  predniSONE (DELTASONE) 20 MG tablet Take 1 tablet (20 mg total) by mouth daily. Patient not taking: Reported on 07/29/2017 07/24/17   Payton Mccallum, MD    Inpatient Medications:  . insulin aspart  0-9 Units Subcutaneous Q6H  . irbesartan  75 mg Oral Daily  . rivaroxaban  20 mg Oral Q supper  . rosuvastatin  10 mg Oral Daily     Allergies: No Known Allergies  Social History   Socioeconomic History  . Marital status: Single    Spouse name: Not on file  . Number of children: Not on file  . Years of education: Not on file  . Highest education level: Not on file  Occupational History  . Not on file  Social Needs  . Financial resource strain: Not on file  . Food insecurity:    Worry: Not on file    Inability: Not on file  . Transportation needs:    Medical: Not on file    Non-medical: Not on file  Tobacco Use  .  Smoking status: Current Some Day Smoker    Types: Cigarettes  . Smokeless tobacco: Never Used  Substance and Sexual Activity  . Alcohol use: Yes    Comment: socially  . Drug use: Yes    Frequency: 2.0 times per week    Types: Marijuana  . Sexual activity: Not on file  Lifestyle  . Physical activity:    Days per week: Not on file    Minutes per session: Not on file  . Stress: Not on file  Relationships  . Social connections:    Talks on phone: Not on file    Gets together: Not on file    Attends religious service: Not on file    Active member of club or organization: Not on file    Attends meetings of  clubs or organizations: Not on file    Relationship status: Not on file  . Intimate partner violence:    Fear of current or ex partner: Not on file    Emotionally abused: Not on file    Physically abused: Not on file    Forced sexual activity: Not on file  Other Topics Concern  . Not on file  Social History Narrative  . Not on file     Family History  Problem Relation Age of Onset  . Stroke Father      Review of Systems Positive for chest pain Negative for: General:  chills, fever, night sweats or weight changes.  Cardiovascular: PND orthopnea syncope dizziness  Dermatological skin lesions rashes Respiratory: Cough congestion Urologic: Frequent urination urination at night and hematuria Abdominal: negative for nausea, vomiting, diarrhea, bright red blood per rectum, melena, or hematemesis Neurologic: negative for visual changes, and/or hearing changes  All other systems reviewed and are otherwise negative except as noted above.  Labs: Recent Labs    07/28/17 2139 07/29/17 0218 07/29/17 0748  TROPONINI <0.03 <0.03 <0.03   Lab Results  Component Value Date   WBC 5.5 07/29/2017   HGB 14.6 07/29/2017   HCT 43.3 07/29/2017   MCV 83.7 07/29/2017   PLT 217 07/29/2017    Recent Labs  Lab 07/29/17 0748  NA 137  K 4.0  CL 104  CO2 28  BUN 11  CREATININE 1.18  CALCIUM 8.8*  GLUCOSE 109*   No results found for: CHOL, HDL, LDLCALC, TRIG No results found for: DDIMER  Radiology/Studies:  Dg Chest 2 View  Result Date: 07/28/2017 CLINICAL DATA:  Shortness of breath and nausea since this morning. EXAM: CHEST - 2 VIEW COMPARISON:  Chest radiograph September 23, 2014 FINDINGS: Cardiomediastinal silhouette is unremarkable for this low inspiratory examination with crowded vasculature markings. The lungs are clear without pleural effusions or focal consolidations. Chronic interstitial changes. Trachea projects midline and there is no pneumothorax. Included soft tissue planes  and osseous structures are non-suspicious. Old LEFT mid clavicle fracture. Spurring RIGHT distal clavicle. IMPRESSION: Chronic interstitial changes without focal consolidation. Electronically Signed   By: Awilda Metroourtnay  Bloomer M.D.   On: 07/28/2017 21:30    EKG: Normal sinus rhythm with preventricular contractions  Weights: Filed Weights   07/28/17 2109 07/29/17 0152  Weight: 255 lb (115.7 kg) 254 lb 4.8 oz (115.3 kg)     Physical Exam: Blood pressure 125/88, pulse (!) 37, temperature 98.2 F (36.8 C), temperature source Oral, resp. rate 16, height 6' (1.829 m), weight 254 lb 4.8 oz (115.3 kg), SpO2 96 %. Body mass index is 34.49 kg/m. General: Well developed, well nourished, in no acute distress.  Head eyes ears nose throat: Normocephalic, atraumatic, sclera non-icteric, no xanthomas, nares are without discharge. No apparent thyromegaly and/or mass  Lungs: Normal respiratory effort.  no wheezes, no rales, no rhonchi.  Heart: RRR with normal S1 S2. no murmur gallop, no rub, PMI is normal size and placement, carotid upstroke normal without bruit, jugular venous pressure is normal Abdomen: Soft, non-tender, non-distended with normoactive bowel sounds. No hepatomegaly. No rebound/guarding. No obvious abdominal masses. Abdominal aorta is normal size without bruit Extremities: No edema. no cyanosis, no clubbing, no ulcers  Peripheral : 2+ bilateral upper extremity pulses, 2+ bilateral femoral pulses, 2+ bilateral dorsal pedal pulse Neuro: Alert and oriented. No facial asymmetry. No focal deficit. Moves all extremities spontaneously. Musculoskeletal: Normal muscle tone without kyphosis Psych:  Responds to questions appropriately with a normal affect.    Assessment: 52 year old male with essential hypertension mixed hyperlipidemia preventricular contractions with atypical chest pain without evidence of myocardial infarction  Plan: 1.  Continue hypertension control with angiotensin receptor  blocker 2.  High intensity cholesterol therapy 3.  Proceed to treadmill EKG for stress test evaluation and risk stratification of chest discomfort  Signed, Lamar Blinks M.D. [facility] Cardiology 07/29/2017, 8:51 AM

## 2017-07-29 NOTE — Progress Notes (Signed)
*  PRELIMINARY RESULTS* Echocardiogram 2D Echocardiogram has been performed.  Joanette GulaJoan M Reatha Sur 07/29/2017, 10:58 AM

## 2017-07-29 NOTE — Discharge Instructions (Signed)
Shortness of Breath, Adult Shortness of breath is when a person has trouble breathing enough air, or when a person feels like she or he is having trouble breathing in enough air. Shortness of breath could be a sign of medical problem. Follow these instructions at home: Pay attention to any changes in your symptoms. Take these actions to help with your condition:  Do not smoke. Smoking is a common cause of shortness of breath. If you smoke and you need help quitting, ask your health care provider.  Avoid things that can irritate your airways, such as: ? Mold. ? Dust. ? Air pollution. ? Chemical fumes. ? Things that can cause allergy symptoms (allergens), if you have allergies.  Keep your living space clean and free of mold and dust.  Rest as needed. Slowly return to your usual activities.  Take over-the-counter and prescription medicines, including oxygen and inhaled medicines, only as told by your health care provider.  Keep all follow-up visits as told by your health care provider. This is important.  Contact a health care provider if:  Your condition does not improve as soon as expected.  You have a hard time doing your normal activities, even after you rest.  You have new symptoms. Get help right away if:  Your shortness of breath gets worse.  You have shortness of breath when you are resting.  You feel light-headed or you faint.  You have a cough that is not controlled with medicines.  You cough up blood.  You have pain with breathing.  You have pain in your chest, arms, shoulders, or abdomen.  You have a fever.  You cannot walk up stairs or exercise the way that you normally do. This information is not intended to replace advice given to you by your health care provider. Make sure you discuss any questions you have with your health care provider. Document Released: 09/24/2000 Document Revised: 07/21/2015 Document Reviewed: 06/07/2015 Elsevier Interactive Patient  Education  2018 [employer]  

## 2017-07-30 LAB — HIV ANTIBODY (ROUTINE TESTING W REFLEX): HIV Screen 4th Generation wRfx: NONREACTIVE

## 2017-08-20 LAB — EXERCISE TOLERANCE TEST
CHL CUP MPHR: 169 {beats}/min
CSEPEDS: 2 s
CSEPEW: 11.2 METS
Exercise duration (min): 10 min
Peak HR: 157 {beats}/min
Percent HR: 92 %
Rest HR: 68 {beats}/min

## 2018-06-28 ENCOUNTER — Ambulatory Visit
Admit: 2018-06-28 | Discharge: 2018-06-28 | Disposition: A | Discharge status: Home / Self Care | Payer: PRIVATE HEALTH INSURANCE

## 2018-06-28 ENCOUNTER — Encounter
Admit: 2018-06-28 | Discharge: 2018-06-28 | Disposition: A | Discharge status: Home / Self Care | Payer: PRIVATE HEALTH INSURANCE

## 2018-06-28 DIAGNOSIS — R002 Palpitations: Secondary | ICD-10-CM

## 2018-06-28 DIAGNOSIS — R079 Chest pain, unspecified: Principal | ICD-10-CM

## 2018-06-28 DIAGNOSIS — R0602 Shortness of breath: Secondary | ICD-10-CM

## 2018-06-28 MED ORDER — RIVAROXABAN 20 MG TABLET
ORAL_TABLET | Freq: Every day | ORAL | 11 refills | 0 days | Status: CP
Start: 2018-06-28 — End: 2019-06-28
  Filled 2018-06-28: qty 30, 30d supply, fill #0

## 2018-06-28 MED FILL — XARELTO 20 MG TABLET: 30 days supply | Qty: 30 | Fill #0 | Status: AC

## 2018-08-18 MED ORDER — CANDESARTAN 8 MG TABLET
ORAL_TABLET | 2 refills | 0 days
Start: 2018-08-18 — End: ?

## 2018-08-26 ENCOUNTER — Other Ambulatory Visit: Payer: Self-pay

## 2018-08-26 DIAGNOSIS — Z20822 Contact with and (suspected) exposure to covid-19: Secondary | ICD-10-CM

## 2018-08-28 LAB — SPECIMEN STATUS REPORT

## 2018-08-28 LAB — NOVEL CORONAVIRUS, NAA: SARS-CoV-2, NAA: NOT DETECTED

## 2018-09-17 ENCOUNTER — Emergency Department: Payer: No Typology Code available for payment source

## 2018-09-17 ENCOUNTER — Other Ambulatory Visit: Payer: Self-pay

## 2018-09-17 ENCOUNTER — Encounter: Payer: Self-pay | Admitting: Emergency Medicine

## 2018-09-17 ENCOUNTER — Emergency Department
Admission: EM | Admit: 2018-09-17 | Discharge: 2018-09-17 | Disposition: A | Discharge status: Home / Self Care | Payer: No Typology Code available for payment source | Attending: Emergency Medicine | Admitting: Emergency Medicine

## 2018-09-17 DIAGNOSIS — E119 Type 2 diabetes mellitus without complications: Secondary | ICD-10-CM | POA: Insufficient documentation

## 2018-09-17 DIAGNOSIS — Y9389 Activity, other specified: Secondary | ICD-10-CM | POA: Insufficient documentation

## 2018-09-17 DIAGNOSIS — Z79899 Other long term (current) drug therapy: Secondary | ICD-10-CM | POA: Diagnosis not present

## 2018-09-17 DIAGNOSIS — Y92410 Unspecified street and highway as the place of occurrence of the external cause: Secondary | ICD-10-CM | POA: Diagnosis not present

## 2018-09-17 DIAGNOSIS — Z7984 Long term (current) use of oral hypoglycemic drugs: Secondary | ICD-10-CM | POA: Diagnosis not present

## 2018-09-17 DIAGNOSIS — S20219A Contusion of unspecified front wall of thorax, initial encounter: Secondary | ICD-10-CM | POA: Diagnosis not present

## 2018-09-17 DIAGNOSIS — Y998 Other external cause status: Secondary | ICD-10-CM | POA: Diagnosis not present

## 2018-09-17 DIAGNOSIS — F17210 Nicotine dependence, cigarettes, uncomplicated: Secondary | ICD-10-CM | POA: Diagnosis not present

## 2018-09-17 DIAGNOSIS — S298XXA Other specified injuries of thorax, initial encounter: Secondary | ICD-10-CM | POA: Diagnosis present

## 2018-09-17 MED ORDER — MELOXICAM 15 MG PO TABS: 15.0000 mg | ORAL_TABLET | Freq: Every day | ORAL | 0 refills | Status: DC
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
## 2018-12-21 ENCOUNTER — Telehealth: Admit: 2018-12-21 | Discharge: 2018-12-22

## 2019-01-03 ENCOUNTER — Ambulatory Visit: Admit: 2019-01-03 | Discharge: 2019-01-04

## 2019-01-09 ENCOUNTER — Emergency Department: Payer: Self-pay

## 2019-01-09 ENCOUNTER — Encounter: Payer: Self-pay | Admitting: Emergency Medicine

## 2019-01-09 ENCOUNTER — Other Ambulatory Visit: Payer: Self-pay

## 2019-01-09 ENCOUNTER — Emergency Department
Admission: EM | Admit: 2019-01-09 | Discharge: 2019-01-09 | Disposition: A | Discharge status: Home / Self Care | Payer: Self-pay | Attending: Emergency Medicine | Admitting: Emergency Medicine

## 2019-01-09 DIAGNOSIS — R0789 Other chest pain: Secondary | ICD-10-CM | POA: Insufficient documentation

## 2019-01-09 DIAGNOSIS — Z20828 Contact with and (suspected) exposure to other viral communicable diseases: Secondary | ICD-10-CM | POA: Insufficient documentation

## 2019-01-09 DIAGNOSIS — Z7984 Long term (current) use of oral hypoglycemic drugs: Secondary | ICD-10-CM | POA: Insufficient documentation

## 2019-01-09 DIAGNOSIS — Z79899 Other long term (current) drug therapy: Secondary | ICD-10-CM | POA: Insufficient documentation

## 2019-01-09 DIAGNOSIS — F17210 Nicotine dependence, cigarettes, uncomplicated: Secondary | ICD-10-CM | POA: Insufficient documentation

## 2019-01-09 DIAGNOSIS — E119 Type 2 diabetes mellitus without complications: Secondary | ICD-10-CM | POA: Insufficient documentation

## 2019-01-09 HISTORY — DX: Anxiety disorder, unspecified: F41.9

## 2019-01-09 LAB — PROTIME-INR
INR: 0.9 (ref 0.8–1.2)
Prothrombin Time: 12.5 seconds (ref 11.4–15.2)

## 2019-01-09 LAB — CBC
HCT: 47.4 % (ref 39.0–52.0)
Hemoglobin: 15.9 g/dL (ref 13.0–17.0)
MCH: 26.8 pg (ref 26.0–34.0)
MCHC: 33.5 g/dL (ref 30.0–36.0)
MCV: 79.9 fL — ABNORMAL LOW (ref 80.0–100.0)
Platelets: 216 10*3/uL (ref 150–400)
RBC: 5.93 MIL/uL — ABNORMAL HIGH (ref 4.22–5.81)
RDW: 14.2 % (ref 11.5–15.5)
WBC: 4.9 10*3/uL (ref 4.0–10.5)
nRBC: 0 % (ref 0.0–0.2)

## 2019-01-09 LAB — BASIC METABOLIC PANEL
Anion gap: 10 (ref 5–15)
BUN: 14 mg/dL (ref 6–20)
CO2: 23 mmol/L (ref 22–32)
Calcium: 8.9 mg/dL (ref 8.9–10.3)
Chloride: 104 mmol/L (ref 98–111)
Creatinine, Ser: 1.29 mg/dL — ABNORMAL HIGH (ref 0.61–1.24)
GFR calc Af Amer: >60 mL/min (ref >60)
GFR calc non Af Amer: >60 mL/min (ref >60)
Glucose, Bld: 184 mg/dL — ABNORMAL HIGH (ref 70–99)
Potassium: 3.9 mmol/L (ref 3.5–5.1)
Sodium: 137 mmol/L (ref 135–145)

## 2019-01-09 LAB — APTT: aPTT: 28 seconds (ref 24–36)

## 2019-01-09 LAB — TROPONIN I (HIGH SENSITIVITY)
Troponin I (High Sensitivity): 5 ng/L (ref <18)
Troponin I (High Sensitivity): 4 ng/L (ref <18)

## 2019-01-09 MED ORDER — LORAZEPAM 1 MG PO TABS: 1.0000 mg | ORAL_TABLET | Freq: Three times a day (TID) | ORAL | 0 refills | Status: AC | PRN

## 2019-01-09 MED ORDER — LORAZEPAM 1 MG PO TABS
1.0000 mg | ORAL_TABLET | Freq: Once | ORAL | Status: AC
  Administered 2019-01-09: 1 mg via ORAL
  Filled 2019-01-09: qty 1

## 2019-01-09 MED ORDER — SODIUM CHLORIDE 0.9% FLUSH: 3.0000 mL | Freq: Once | INTRAVENOUS | Status: DC

## 2019-01-09 NOTE — ED Provider Notes (Signed)
[facility] Emergency Department Provider Note  ____________________________________________   First MD Initiated Contact with Patient 01/09/19 1230     (approximate)  I have reviewed the triage vital signs and the nursing notes.   HISTORY  Chief Complaint Chest Pain    HPI David Robles is a 53 y.o. male  With PMHx sarcoidosis, diabetes, prior DVT/PE on Xarelto here with mild chest pressure. No SOB. Pt reports that earlier today, he developed mild aching, reproducible anterior chest wall pain> he was resting at the time and reports that this occurs in the setting of prior stressors. He had some mild worsening of his chronic SOB with this. No diaphoresis. He had some tingling in his b/l hands along with lightheadedness. He felt anxious at the time as well with significant increased recent stressors (multiple family members w/ COVID). Denies any fever himself. No myalgias or arthralgias. His cough is chronic and not acutely worsened. Denies any leg swelling or pain and he's been compliant with his Xarelto. No other complaints.       Past Medical History:  Diagnosis Date  . Anxiety   . Diabetes mellitus without complication (HCC)   . Diverticulitis   . Pulmonary embolism (HCC) 2004  . Sarcoidosis     Patient Active Problem List   Diagnosis Date Noted  . Chest pain 07/29/2017  . Sarcoidosis 07/29/2017  . Diabetes (HCC) 07/29/2017    Past Surgical History:  Procedure Laterality Date  . ABDOMINAL HERNIA REPAIR  2014  . COLECTOMY  2004  . COLOSTOMY REVISION  2004    Prior to Admission medications   Medication Sig Start Date End Date Taking? Authorizing Provider  albuterol (PROVENTIL HFA;VENTOLIN HFA) 108 (90 Base) MCG/ACT inhaler Inhale into the lungs every 6 (six) hours as needed for wheezing or shortness of breath.    [provider]  candesartan (ATACAND) 8 MG tablet TAKE 1 TABLET BY MOUTH EVERY DAY IN THE MORNING 06/05/17   [provider]  LORazepam (ATIVAN) 1 MG tablet Take 1 tablet (1 mg total) by mouth every 8 (eight) hours as needed for anxiety. 01/09/19 01/09/20  Shaune PollackIsaacs, Phyillis Dascoli, MD  meloxicam (MOBIC) 15 MG tablet Take 1 tablet (15 mg total) by mouth daily. 09/17/18   Cuthriell, Delorise RoyalsJonathan D, PA-C  metFORMIN (GLUCOPHAGE) 500 MG tablet Take 500 mg by mouth 2 (two) times daily with a meal.    [provider]  rivaroxaban (XARELTO) 20 MG TABS tablet Take 20 mg by mouth daily with supper.    [provider]  rosuvastatin (CRESTOR) 10 MG tablet Take 10 mg by mouth daily.  02/11/17   [provider]    Allergies Patient has no known allergies.  Family History  Problem Relation Age of Onset  . Stroke Father     Social History Social History   Tobacco Use  . Smoking status: Current Some Day Smoker    Types: Cigarettes  . Smokeless tobacco: Never Used  Substance Use Topics  . Alcohol use: Yes    Comment: socially  . Drug use: Yes    Frequency: 2.0 times per week    Types: Marijuana    Review of Systems  Review of Systems  Constitutional: Positive for fatigue. Negative for chills and fever.  HENT: Negative for sore throat.   Respiratory: Positive for shortness of breath.   Cardiovascular: Positive for chest pain.  Gastrointestinal: Negative for abdominal pain.  Genitourinary: Negative for flank pain.  Musculoskeletal: Negative for  neck pain.  Skin: Negative for rash and wound.  Allergic/Immunologic: Negative for immunocompromised state.  Neurological: Positive for weakness, light-headedness and numbness.  Hematological: Does not bruise/bleed easily.     ____________________________________________  PHYSICAL EXAM:      VITAL SIGNS: ED Triage Vitals  Enc Vitals Group     BP 01/09/19 1022 (!) 156/93     Pulse Rate 01/09/19 1022 82     Resp 01/09/19 1022 16     Temp 01/09/19 1022 99.1 F (37.3 C)     Temp src --      SpO2 01/09/19 1022 99 %     Weight 01/09/19  1022 250 lb (113.4 kg)     Height 01/09/19 1022 6\' 3"  (1.905 m)     Head Circumference --      Peak Flow --      Pain Score 01/09/19 1021 7     Pain Loc --      Pain Edu? --      Excl. in GC? --      Physical Exam Vitals and nursing note reviewed.  Constitutional:      General: He is not in acute distress.    Appearance: He is well-developed.  HENT:     Head: Normocephalic and atraumatic.  Eyes:     Conjunctiva/sclera: Conjunctivae normal.  Cardiovascular:     Rate and Rhythm: Normal rate and regular rhythm.     Heart sounds: Normal heart sounds. No murmur. No friction rub.  Pulmonary:     Effort: Pulmonary effort is normal. No respiratory distress.     Breath sounds: Normal breath sounds. No wheezing or rales.  Chest:     Chest wall: Tenderness (moderate TTP to anterior chest wall, reproducible, no lesions) present.  Abdominal:     General: There is no distension.     Palpations: Abdomen is soft.     Tenderness: There is no abdominal tenderness.  Musculoskeletal:     Cervical back: Neck supple.  Skin:    General: Skin is warm.     Capillary Refill: Capillary refill takes less than 2 seconds.  Neurological:     Mental Status: He is alert and oriented to person, place, and time.     Motor: No abnormal muscle tone.       ____________________________________________   LABS (all labs ordered are listed, but only abnormal results are displayed)  Labs Reviewed  BASIC METABOLIC PANEL - Abnormal; Notable for the following components:      Result Value   Glucose, Bld 184 (*)    Creatinine, Ser 1.29 (*)    All other components within normal limits  CBC - Abnormal; Notable for the following components:   RBC 5.93 (*)    MCV 79.9 (*)    All other components within normal limits  NOVEL CORONAVIRUS, NAA (HOSP ORDER, SEND-OUT TO REF LAB; TAT 18-24 HRS)  PROTIME-INR  APTT  TROPONIN I (HIGH SENSITIVITY)  TROPONIN I (HIGH SENSITIVITY)     ____________________________________________  EKG: Normal sinus rhythm, VR 85. PR 100, QRS 430. No ST elevations or depressions. No acute ischemia or infarct. No change from Jul 2019. ________________________________________  RADIOLOGY All imaging, including plain films, CT scans, and ultrasounds, independently reviewed by me, and interpretations confirmed via formal radiology reads.  ED MD interpretation:   CXR: baseline sarcoidosis, no signs of PNA, CHF  Official radiology report(s): DG Chest 2 View  Result Date: 01/09/2019 CLINICAL DATA:  53 year old male with chest pain and  shortness of breath. Clinical history includes prior pulmonary embolism and a known history of sarcoidosis. EXAM: CHEST - 2 VIEW COMPARISON:  Prior chest x-ray 09/17/2018 FINDINGS: Cardiac and mediastinal contours remain unchanged. Diffuse bronchitic changes and interstitial prominence appear unchanged compared to prior imaging. No new focal airspace consolidation, pulmonary edema, pleural effusion or pneumothorax. Remote healed left mid clavicular fracture. Degenerative changes at the right acromioclavicular joint. No acute osseous abnormality. IMPRESSION: Stable chest x-ray without evidence of acute cardiopulmonary process. Diffuse bilateral bronchitic changes and interstitial prominence remain unchanged and likely reflect chronic parenchymal changes related to patient's known sarcoidosis. Electronically Signed   By: Jacqulynn Cadet M.D.   On: 01/09/2019 11:03    ____________________________________________  PROCEDURES   Procedure(s) performed (including Critical Care):  Procedures  ____________________________________________  INITIAL IMPRESSION / MDM / [place] / ED COURSE  As part of my medical decision making, I reviewed the following data within the [place] notes reviewed and incorporated, Old chart reviewed, Notes from prior ED visits, and [place] Controlled  Substance Database       *David Robles was evaluated in Emergency Department on 01/09/2019 for the symptoms described in the history of present illness. He was evaluated in the context of the global COVID-19 pandemic, which necessitated consideration that the patient might be at risk for infection with the SARS-CoV-2 virus that causes COVID-19. Institutional protocols and algorithms that pertain to the evaluation of patients at risk for COVID-19 are in a state of rapid change based on information released by regulatory bodies including the CDC and federal and state organizations. These policies and algorithms were followed during the patient's care in the ED.  Some ED evaluations and interventions may be delayed as a result of limited staffing during the pandemic.*     Medical Decision Making:  53 yo M here with atypical, reproducible anterior chest pain. Pt also reports h/o anxiety with similar issues and reports multiple recent stressors. Pain improve w/ ativan.  DDx includes chest wall pain, MSK vs secondary to sarcoidosis w/ chronic coughing, anxiety, less likely GI etiology. Pain is not sharp, tearing, and he has no radiation to the back, mild baseline HTN, and no sx to suggest dissection. He has been compliant with his Xarelto and has no leg pain, leg swelling, hypoxia, tachypnea, and I do not suspect clinically significant PE. No clinical evidence of CHF or right heart strain. This does correlate with recent stressors so suspect it could have a component of anxiety as well.  Discussed management options with pt. Given normal CXR, neg trop x 2 despite constant sx, and reassuring labs otherwise, will treat symptomatically as well as give brief course of ativan PRN. Return precautions given.  ____________________________________________  FINAL CLINICAL IMPRESSION(S) / ED DIAGNOSES  Final diagnoses:  Atypical chest pain     MEDICATIONS GIVEN DURING THIS VISIT:  Medications  sodium  chloride flush (NS) 0.9 % injection 3 mL (has no administration in time range)  LORazepam (ATIVAN) tablet 1 mg (1 mg Oral Given 01/09/19 1306)     ED Discharge Orders         Ordered    LORazepam (ATIVAN) 1 MG tablet  Every 8 hours PRN     01/09/19 1549           Note:  This document was prepared using Dragon voice recognition software and may include unintentional dictation errors.   Duffy Bruce, MD 01/09/19 1759

## 2019-01-09 NOTE — ED Triage Notes (Signed)
C/O chest pain and tingling to hands bilaterally x 1 hour.  Patient states he has had similar symptoms in the past, which were related to anxiety.  AAOx3.  Skin warm and dry. NAD

## 2019-01-18 ENCOUNTER — Telehealth: Payer: Self-pay

## 2019-01-18 NOTE — Telephone Encounter (Signed)
Caller advise that result not back yet  

## 2019-01-19 LAB — NOVEL CORONAVIRUS, NAA (HOSP ORDER, SEND-OUT TO REF LAB; TAT 18-24 HRS): SARS-CoV-2, NAA: NOT DETECTED

## 2019-01-19 NOTE — Telephone Encounter (Signed)
Pt called back in for covid results. Pt says that he was tested on 01/09/19 at the ED. Spoke with NT and was told to send TE and they will look into for pt further. Advised pt that someone will call him back.   Pt expressed understanding

## 2019-01-19 NOTE — Telephone Encounter (Signed)
Pt given result; he verbalized understanding; not able to chart in result note because encounter not created.

## 2019-01-19 NOTE — Telephone Encounter (Addendum)
Spoke with Rivka Barbara at [employer] [employer] regarding test; she says the test was resulted but stayed in their system because there was no associated account number; Rivka Barbara states that she could not release the results because there was not an associated account number;explained that the test was obtained at [facility] on 01/09/2019; specimen ID verified; result released; will attempt to notify pt.

## 2019-01-19 NOTE — Telephone Encounter (Signed)
Spoke with Marylene Land in lab at [facility]; states this was sent to [facility]; will attempt to contact lab.

## 2019-01-20 ENCOUNTER — Telehealth: Admit: 2019-01-20 | Discharge: 2019-01-21

## 2019-01-20 DIAGNOSIS — M1990 Unspecified osteoarthritis, unspecified site: Principal | ICD-10-CM

## 2019-01-20 DIAGNOSIS — I2699 Other pulmonary embolism without acute cor pulmonale: Principal | ICD-10-CM

## 2019-01-20 DIAGNOSIS — G4733 Obstructive sleep apnea (adult) (pediatric): Principal | ICD-10-CM

## 2019-01-20 DIAGNOSIS — D869 Sarcoidosis, unspecified: Principal | ICD-10-CM

## 2019-01-20 DIAGNOSIS — D863 Sarcoidosis of skin: Principal | ICD-10-CM

## 2019-01-20 DIAGNOSIS — R0600 Dyspnea, unspecified: Principal | ICD-10-CM

## 2019-01-20 DIAGNOSIS — G5603 Carpal tunnel syndrome, bilateral upper limbs: Principal | ICD-10-CM

## 2019-02-16 ENCOUNTER — Ambulatory Visit: Admit: 2019-02-16 | Discharge: 2019-02-17

## 2019-02-17 MED ORDER — METFORMIN 1,000 MG TABLET
ORAL_TABLET | Freq: Two times a day (BID) | ORAL | 0 refills | 30 days
Start: 2019-02-17 — End: ?

## 2019-02-22 ENCOUNTER — Institutional Professional Consult (permissible substitution): Admit: 2019-02-22 | Discharge: 2019-02-23

## 2019-03-17 MED FILL — XARELTO 20 MG TABLET: 30 days supply | Qty: 30 | Fill #1 | Status: AC

## 2019-03-17 MED FILL — XARELTO 20 MG TABLET: ORAL | 30 days supply | Qty: 30 | Fill #1

## 2019-03-24 ENCOUNTER — Ambulatory Visit: Admit: 2019-03-24 | Discharge: 2019-03-25 | Attending: Cardiovascular Disease | Primary: Cardiovascular Disease

## 2019-03-24 DIAGNOSIS — D869 Sarcoidosis, unspecified: Principal | ICD-10-CM

## 2019-03-24 DIAGNOSIS — I2699 Other pulmonary embolism without acute cor pulmonale: Principal | ICD-10-CM

## 2019-04-05 MED ORDER — ALBUTEROL SULFATE HFA 90 MCG/ACTUATION AEROSOL INHALER
Freq: Four times a day (QID) | RESPIRATORY_TRACT | 9 refills | 0 days | Status: CP | PRN
Start: 2019-04-05 — End: ?
  Filled 2019-04-20: qty 8.5, 16d supply, fill #0

## 2019-04-05 MED ORDER — FLUTICASONE PROPIONATE 50 MCG/ACTUATION NASAL SPRAY,SUSPENSION
Freq: Every day | NASAL | 0 refills | 0.00000 days | Status: CP | PRN
Start: 2019-04-05 — End: ?
  Filled 2019-04-20: qty 16, 60d supply, fill #0

## 2019-04-20 ENCOUNTER — Ambulatory Visit: Admit: 2019-04-20 | Discharge: 2019-04-21

## 2019-04-20 DIAGNOSIS — D863 Sarcoidosis of skin: Principal | ICD-10-CM

## 2019-04-20 DIAGNOSIS — D869 Sarcoidosis, unspecified: Principal | ICD-10-CM

## 2019-04-20 DIAGNOSIS — G5603 Carpal tunnel syndrome, bilateral upper limbs: Principal | ICD-10-CM

## 2019-04-20 MED FILL — FLUTICASONE PROPIONATE 50 MCG/ACTUATION NASAL SPRAY,SUSPENSION: 60 days supply | Qty: 16 | Fill #0 | Status: AC

## 2019-04-20 MED FILL — ALBUTEROL SULFATE HFA 90 MCG/ACTUATION AEROSOL INHALER: 16 days supply | Qty: 8 | Fill #0 | Status: AC

## 2019-04-20 MED FILL — XARELTO 20 MG TABLET: 30 days supply | Qty: 30 | Fill #2 | Status: AC

## 2019-04-20 MED FILL — XARELTO 20 MG TABLET: ORAL | 30 days supply | Qty: 30 | Fill #2

## 2019-04-21 ENCOUNTER — Ambulatory Visit: Admit: 2019-04-21 | Discharge: 2019-04-22

## 2019-05-02 ENCOUNTER — Ambulatory Visit: Admit: 2019-05-02 | Discharge: 2019-05-03

## 2019-05-02 DIAGNOSIS — G5603 Carpal tunnel syndrome, bilateral upper limbs: Principal | ICD-10-CM

## 2019-05-02 DIAGNOSIS — S62632A Displaced fracture of distal phalanx of right middle finger, initial encounter for closed fracture: Principal | ICD-10-CM

## 2019-05-16 DIAGNOSIS — G5603 Carpal tunnel syndrome, bilateral upper limbs: Principal | ICD-10-CM

## 2019-05-16 DIAGNOSIS — E559 Vitamin D deficiency, unspecified: Principal | ICD-10-CM

## 2019-05-16 MED ORDER — ERGOCALCIFEROL (VITAMIN D2) 1,250 MCG (50,000 UNIT) CAPSULE
ORAL_CAPSULE | ORAL | 2 refills | 28.00000 days | Status: CP
Start: 2019-05-16 — End: 2019-08-02

## 2019-05-20 ENCOUNTER — Ambulatory Visit: Admit: 2019-05-20 | Discharge: 2019-05-21

## 2019-05-20 ENCOUNTER — Ambulatory Visit: Admit: 2019-05-20 | Discharge: 2019-05-21 | Attending: Orthopaedic Surgery | Primary: Orthopaedic Surgery

## 2019-05-24 MED FILL — METFORMIN 1,000 MG TABLET: 30 days supply | Qty: 60 | Fill #0 | Status: AC

## 2019-05-24 MED FILL — METFORMIN 1,000 MG TABLET: ORAL | 30 days supply | Qty: 60 | Fill #0

## 2019-06-10 DIAGNOSIS — E559 Vitamin D deficiency, unspecified: Principal | ICD-10-CM

## 2019-06-10 MED ORDER — ERGOCALCIFEROL (VITAMIN D2) 1,250 MCG (50,000 UNIT) CAPSULE
ORAL_CAPSULE | ORAL | 2 refills | 28.00000 days | Status: CP
Start: 2019-06-10 — End: 2019-08-27
  Filled 2019-06-17: qty 4, 28d supply, fill #0

## 2019-06-17 MED FILL — XARELTO 20 MG TABLET: 30 days supply | Qty: 30 | Fill #3 | Status: AC

## 2019-06-17 MED FILL — ERGOCALCIFEROL (VITAMIN D2) 1,250 MCG (50,000 UNIT) CAPSULE: 28 days supply | Qty: 4 | Fill #0 | Status: AC

## 2019-06-17 MED FILL — XARELTO 20 MG TABLET: ORAL | 30 days supply | Qty: 30 | Fill #3

## 2019-06-23 ENCOUNTER — Ambulatory Visit: Admit: 2019-06-23 | Discharge: 2019-06-24 | Attending: Cardiovascular Disease | Primary: Cardiovascular Disease

## 2019-06-23 DIAGNOSIS — I10 Essential (primary) hypertension: Principal | ICD-10-CM

## 2019-06-23 DIAGNOSIS — I499 Cardiac arrhythmia, unspecified: Principal | ICD-10-CM

## 2019-06-23 MED ORDER — METOPROLOL SUCCINATE ER 25 MG TABLET,EXTENDED RELEASE 24 HR
ORAL_TABLET | Freq: Every evening | ORAL | 11 refills | 30.00000 days | Status: CP
Start: 2019-06-23 — End: 2020-06-22
  Filled 2019-06-30: qty 30, 30d supply, fill #0

## 2019-06-28 ENCOUNTER — Ambulatory Visit: Admit: 2019-06-28 | Discharge: 2019-06-29 | Attending: Dermatology | Primary: Dermatology

## 2019-06-28 MED ORDER — TRIAMCINOLONE ACETONIDE 0.1 % TOPICAL CREAM
1 refills | 0 days | Status: CP
Start: 2019-06-28 — End: ?
  Filled 2019-06-30: qty 454, 30d supply, fill #0

## 2019-06-30 MED FILL — CANDESARTAN 8 MG TABLET: 30 days supply | Qty: 30 | Fill #0

## 2019-06-30 MED FILL — TRIAMCINOLONE ACETONIDE 0.1 % TOPICAL CREAM: 30 days supply | Qty: 454 | Fill #0 | Status: AC

## 2019-06-30 MED FILL — METOPROLOL SUCCINATE ER 25 MG TABLET,EXTENDED RELEASE 24 HR: 30 days supply | Qty: 30 | Fill #0 | Status: AC

## 2019-06-30 MED FILL — CANDESARTAN 8 MG TABLET: 30 days supply | Qty: 30 | Fill #0 | Status: AC

## 2019-07-14 ENCOUNTER — Ambulatory Visit: Admit: 2019-07-14 | Discharge: 2019-07-15

## 2019-07-14 DIAGNOSIS — M545 Low back pain: Secondary | ICD-10-CM

## 2019-07-14 DIAGNOSIS — M7062 Trochanteric bursitis, left hip: Principal | ICD-10-CM

## 2019-07-14 DIAGNOSIS — D869 Sarcoidosis, unspecified: Principal | ICD-10-CM

## 2019-07-14 DIAGNOSIS — M222X1 Patellofemoral disorders, right knee: Secondary | ICD-10-CM

## 2019-07-14 DIAGNOSIS — H538 Other visual disturbances: Principal | ICD-10-CM

## 2019-07-14 DIAGNOSIS — M222X2 Patellofemoral disorders, left knee: Principal | ICD-10-CM

## 2019-07-14 DIAGNOSIS — G8929 Other chronic pain: Secondary | ICD-10-CM

## 2019-07-14 MED ORDER — DICLOFENAC 1 % TOPICAL GEL
Freq: Four times a day (QID) | TOPICAL | 0 refills | 13.00000 days | Status: CP
Start: 2019-07-14 — End: 2020-07-13
  Filled 2019-07-14: qty 100, 13d supply, fill #0

## 2019-07-14 MED FILL — DICLOFENAC 1 % TOPICAL GEL: 13 days supply | Qty: 100 | Fill #0 | Status: AC

## 2019-08-08 ENCOUNTER — Ambulatory Visit: Admit: 2019-08-08 | Discharge: 2019-08-09

## 2019-08-08 DIAGNOSIS — I499 Cardiac arrhythmia, unspecified: Principal | ICD-10-CM

## 2019-08-08 DIAGNOSIS — D869 Sarcoidosis, unspecified: Principal | ICD-10-CM

## 2019-08-08 MED ORDER — RIVAROXABAN 20 MG TABLET
ORAL_TABLET | Freq: Every day | ORAL | 11 refills | 30.00000 days | Status: CP
Start: 2019-08-08 — End: 2020-08-07
  Filled 2019-08-09: qty 30, 30d supply, fill #0

## 2019-08-08 MED ORDER — METOPROLOL SUCCINATE ER 25 MG TABLET,EXTENDED RELEASE 24 HR
ORAL_TABLET | Freq: Every evening | ORAL | 11 refills | 30.00000 days | Status: CP
Start: 2019-08-08 — End: 2020-08-07
  Filled 2019-08-09: qty 60, 30d supply, fill #0

## 2019-08-09 MED FILL — XARELTO 20 MG TABLET: 30 days supply | Qty: 30 | Fill #0 | Status: AC

## 2019-08-09 MED FILL — METOPROLOL SUCCINATE ER 25 MG TABLET,EXTENDED RELEASE 24 HR: 30 days supply | Qty: 60 | Fill #0 | Status: AC

## 2019-08-24 ENCOUNTER — Ambulatory Visit
Admit: 2019-08-24 | Discharge: 2019-08-25 | Attending: Student in an Organized Health Care Education/Training Program | Primary: Student in an Organized Health Care Education/Training Program

## 2019-08-24 DIAGNOSIS — H02883 Meibomian gland dysfunction of right eye, unspecified eyelid: Principal | ICD-10-CM

## 2019-08-24 DIAGNOSIS — D869 Sarcoidosis, unspecified: Principal | ICD-10-CM

## 2019-08-24 DIAGNOSIS — H02886 Meibomian gland dysfunction of left eye, unspecified eyelid: Principal | ICD-10-CM

## 2019-08-24 MED FILL — ERGOCALCIFEROL (VITAMIN D2) 1,250 MCG (50,000 UNIT) CAPSULE: 28 days supply | Qty: 4 | Fill #1 | Status: AC

## 2019-08-24 MED FILL — ERGOCALCIFEROL (VITAMIN D2) 1,250 MCG (50,000 UNIT) CAPSULE: ORAL | 28 days supply | Qty: 4 | Fill #1

## 2019-08-26 MED ORDER — CANDESARTAN 8 MG TABLET
ORAL_TABLET | Freq: Every day | ORAL | 1 refills | 90.00000 days | Status: CP
Start: 2019-08-26 — End: ?
  Filled 2019-09-01: qty 30, 30d supply, fill #0

## 2019-08-31 MED ORDER — CANDESARTAN 8 MG TABLET
ORAL_TABLET | 0 refills | 0 days
Start: 2019-08-31 — End: ?

## 2019-09-01 MED ORDER — METFORMIN 1,000 MG TABLET
ORAL_TABLET | 0 refills | 0 days
Start: 2019-09-01 — End: ?

## 2019-09-01 MED FILL — CANDESARTAN 8 MG TABLET: 30 days supply | Qty: 30 | Fill #0 | Status: AC

## 2019-09-01 MED FILL — METFORMIN 1,000 MG TABLET: 30 days supply | Qty: 60 | Fill #0 | Status: AC

## 2019-09-01 MED FILL — METFORMIN 1,000 MG TABLET: 30 days supply | Qty: 60 | Fill #0

## 2019-09-06 ENCOUNTER — Ambulatory Visit: Admit: 2019-09-06 | Discharge: 2019-09-07

## 2019-09-09 ENCOUNTER — Ambulatory Visit: Admit: 2019-09-09 | Discharge: 2019-09-10 | Attending: Orthopaedic Surgery | Primary: Orthopaedic Surgery

## 2019-09-20 MED FILL — XARELTO 20 MG TABLET: ORAL | 30 days supply | Qty: 30 | Fill #1

## 2019-09-20 MED FILL — XARELTO 20 MG TABLET: 30 days supply | Qty: 30 | Fill #1 | Status: AC

## 2019-09-20 MED FILL — ERGOCALCIFEROL (VITAMIN D2) 1,250 MCG (50,000 UNIT) CAPSULE: 28 days supply | Qty: 4 | Fill #2 | Status: AC

## 2019-09-20 MED FILL — ERGOCALCIFEROL (VITAMIN D2) 1,250 MCG (50,000 UNIT) CAPSULE: ORAL | 28 days supply | Qty: 4 | Fill #2

## 2019-09-26 ENCOUNTER — Ambulatory Visit: Admit: 2019-09-26

## 2019-09-27 MED FILL — METOPROLOL SUCCINATE ER 25 MG TABLET,EXTENDED RELEASE 24 HR: ORAL | 30 days supply | Qty: 60 | Fill #0

## 2019-09-27 MED FILL — METOPROLOL SUCCINATE ER 25 MG TABLET,EXTENDED RELEASE 24 HR: 30 days supply | Qty: 60 | Fill #0 | Status: AC

## 2019-09-29 ENCOUNTER — Emergency Department
Admit: 2019-09-29 | Discharge: 2019-09-29 | Disposition: A | Discharge status: Home / Self Care | Payer: PRIVATE HEALTH INSURANCE

## 2019-09-29 ENCOUNTER — Ambulatory Visit
Admit: 2019-09-29 | Discharge: 2019-09-29 | Disposition: A | Discharge status: Home / Self Care | Payer: PRIVATE HEALTH INSURANCE

## 2019-09-29 DIAGNOSIS — R0789 Other chest pain: Principal | ICD-10-CM

## 2019-09-30 ENCOUNTER — Ambulatory Visit: Admit: 2019-09-30 | Discharge: 2019-10-01 | Attending: Adult Health | Primary: Adult Health

## 2019-09-30 DIAGNOSIS — I472 Ventricular tachycardia: Principal | ICD-10-CM

## 2019-09-30 DIAGNOSIS — D8685 Sarcoid myocarditis: Principal | ICD-10-CM

## 2019-09-30 DIAGNOSIS — D869 Sarcoidosis, unspecified: Principal | ICD-10-CM

## 2019-09-30 MED ORDER — PANTOPRAZOLE 40 MG TABLET,DELAYED RELEASE
ORAL_TABLET | Freq: Every day | ORAL | 3 refills | 90 days | Status: CP
Start: 2019-09-30 — End: 2020-09-29
  Filled 2019-09-30: qty 90, 90d supply, fill #0

## 2019-09-30 MED ORDER — CANDESARTAN 8 MG TABLET
ORAL_TABLET | Freq: Every day | ORAL | 1 refills | 90.00000 days | Status: CP
Start: 2019-09-30 — End: ?
  Filled 2019-10-13: qty 90, 90d supply, fill #0

## 2019-09-30 MED ORDER — SULFAMETHOXAZOLE 800 MG-TRIMETHOPRIM 160 MG TABLET
ORAL_TABLET | ORAL | 3 refills | 84 days | Status: CP
Start: 2019-09-30 — End: 2020-09-29
  Filled 2019-09-30: qty 36, 84d supply, fill #0

## 2019-09-30 MED ORDER — PREDNISONE 20 MG TABLET
ORAL_TABLET | ORAL | 0 refills | 150.00000 days | Status: CP
Start: 2019-09-30 — End: 2020-02-27
  Filled 2019-09-30: qty 135, 90d supply, fill #0

## 2019-09-30 MED ORDER — METOPROLOL SUCCINATE ER 25 MG TABLET,EXTENDED RELEASE 24 HR
ORAL_TABLET | Freq: Every evening | ORAL | 3 refills | 90.00000 days | Status: CP
Start: 2019-09-30 — End: 2020-09-29
  Filled 2019-09-30: qty 270, 90d supply, fill #0

## 2019-09-30 MED ORDER — SACUBITRIL 24 MG-VALSARTAN 26 MG TABLET
ORAL_TABLET | Freq: Two times a day (BID) | ORAL | 3 refills | 90.00000 days | Status: CP
Start: 2019-09-30 — End: 2019-09-30
  Filled 2019-09-30: qty 180, 90d supply, fill #0

## 2019-09-30 MED FILL — SULFAMETHOXAZOLE 800 MG-TRIMETHOPRIM 160 MG TABLET: 84 days supply | Qty: 36 | Fill #0 | Status: AC

## 2019-09-30 MED FILL — PANTOPRAZOLE 40 MG TABLET,DELAYED RELEASE: 90 days supply | Qty: 90 | Fill #0 | Status: AC

## 2019-09-30 MED FILL — METOPROLOL SUCCINATE ER 25 MG TABLET,EXTENDED RELEASE 24 HR: 90 days supply | Qty: 270 | Fill #0 | Status: AC

## 2019-09-30 MED FILL — PREDNISONE 20 MG TABLET: 90 days supply | Qty: 135 | Fill #0 | Status: AC

## 2019-09-30 MED FILL — ENTRESTO 24 MG-26 MG TABLET: 90 days supply | Qty: 180 | Fill #0 | Status: AC

## 2019-10-03 DIAGNOSIS — D869 Sarcoidosis, unspecified: Principal | ICD-10-CM

## 2019-10-03 DIAGNOSIS — D8685 Sarcoid myocarditis: Principal | ICD-10-CM

## 2019-10-03 DIAGNOSIS — I472 Ventricular tachycardia: Principal | ICD-10-CM

## 2019-10-13 MED FILL — CANDESARTAN 8 MG TABLET: 90 days supply | Qty: 90 | Fill #0 | Status: AC

## 2019-10-19 ENCOUNTER — Ambulatory Visit: Admit: 2019-10-19 | Discharge: 2019-10-20 | Payer: PRIVATE HEALTH INSURANCE

## 2019-10-20 ENCOUNTER — Ambulatory Visit: Admit: 2019-10-20 | Discharge: 2019-10-20 | Attending: Adult Health | Primary: Adult Health

## 2019-10-20 ENCOUNTER — Ambulatory Visit: Admit: 2019-10-20 | Discharge: 2019-10-20

## 2019-10-20 DIAGNOSIS — I472 Ventricular tachycardia: Principal | ICD-10-CM

## 2019-10-20 DIAGNOSIS — G5603 Carpal tunnel syndrome, bilateral upper limbs: Principal | ICD-10-CM

## 2019-10-20 DIAGNOSIS — E559 Vitamin D deficiency, unspecified: Principal | ICD-10-CM

## 2019-10-20 DIAGNOSIS — D869 Sarcoidosis, unspecified: Principal | ICD-10-CM

## 2019-10-20 DIAGNOSIS — E1169 Type 2 diabetes mellitus with other specified complication: Principal | ICD-10-CM

## 2019-10-20 MED ORDER — METOPROLOL SUCCINATE ER 100 MG TABLET,EXTENDED RELEASE 24 HR
ORAL_TABLET | Freq: Every evening | ORAL | 3 refills | 90 days
Start: 2019-10-20 — End: 2020-10-19

## 2019-10-21 ENCOUNTER — Telehealth: Admit: 2019-10-21 | Discharge: 2019-10-22

## 2019-10-21 DIAGNOSIS — I10 Essential (primary) hypertension: Principal | ICD-10-CM

## 2019-10-21 DIAGNOSIS — I2699 Other pulmonary embolism without acute cor pulmonale: Principal | ICD-10-CM

## 2019-10-21 DIAGNOSIS — E1169 Type 2 diabetes mellitus with other specified complication: Principal | ICD-10-CM

## 2019-10-21 DIAGNOSIS — I472 Ventricular tachycardia: Principal | ICD-10-CM

## 2019-10-21 DIAGNOSIS — Z01818 Encounter for other preprocedural examination: Principal | ICD-10-CM

## 2019-10-21 DIAGNOSIS — D869 Sarcoidosis, unspecified: Principal | ICD-10-CM

## 2019-10-21 DIAGNOSIS — G4733 Obstructive sleep apnea (adult) (pediatric): Principal | ICD-10-CM

## 2019-10-26 MED ORDER — METFORMIN 1,000 MG TABLET
ORAL_TABLET | Freq: Two times a day (BID) | ORAL | 0 refills | 30.00000 days
Start: 2019-10-26 — End: ?

## 2019-10-27 MED FILL — METFORMIN 1,000 MG TABLET: ORAL | 30 days supply | Qty: 60 | Fill #0

## 2019-10-27 MED FILL — METFORMIN 1,000 MG TABLET: 30 days supply | Qty: 60 | Fill #0 | Status: AC

## 2019-10-28 MED FILL — XARELTO 20 MG TABLET: 30 days supply | Qty: 30 | Fill #0 | Status: AC

## 2019-10-28 MED FILL — XARELTO 20 MG TABLET: ORAL | 30 days supply | Qty: 30 | Fill #0

## 2019-11-01 ENCOUNTER — Ambulatory Visit: Admit: 2019-11-01 | Discharge: 2019-11-02

## 2019-11-01 ENCOUNTER — Ambulatory Visit: Admit: 2019-11-01 | Discharge: 2019-11-02 | Attending: Cardiovascular Disease | Primary: Cardiovascular Disease

## 2019-11-01 DIAGNOSIS — E119 Type 2 diabetes mellitus without complications: Principal | ICD-10-CM

## 2019-11-01 DIAGNOSIS — D8685 Sarcoid myocarditis: Principal | ICD-10-CM

## 2019-11-01 DIAGNOSIS — I472 Ventricular tachycardia: Principal | ICD-10-CM

## 2019-11-02 MED ORDER — METOPROLOL SUCCINATE ER 100 MG TABLET,EXTENDED RELEASE 24 HR
ORAL_TABLET | Freq: Every evening | ORAL | 3 refills | 90.00000 days | Status: CP
Start: 2019-11-02 — End: 2020-11-01
  Filled 2020-01-05: qty 135, 90d supply, fill #0

## 2019-11-07 ENCOUNTER — Ambulatory Visit: Admit: 2019-11-07 | Discharge: 2019-11-07

## 2019-11-07 DIAGNOSIS — I472 Ventricular tachycardia: Principal | ICD-10-CM

## 2019-11-07 MED ORDER — ROSUVASTATIN 10 MG TABLET
ORAL_TABLET | Freq: Every day | ORAL | 3 refills | 90.00000 days | Status: CP
Start: 2019-11-07 — End: ?
  Filled 2019-11-11: qty 90, 90d supply, fill #0

## 2019-11-11 MED FILL — ROSUVASTATIN 10 MG TABLET: 90 days supply | Qty: 90 | Fill #0 | Status: AC

## 2019-11-30 ENCOUNTER — Ambulatory Visit: Admit: 2019-11-30 | Discharge: 2019-12-01 | Attending: Adult Health | Primary: Adult Health

## 2019-11-30 DIAGNOSIS — D869 Sarcoidosis, unspecified: Principal | ICD-10-CM

## 2019-11-30 DIAGNOSIS — D8685 Sarcoid myocarditis: Principal | ICD-10-CM

## 2019-11-30 MED ORDER — PANTOPRAZOLE 40 MG TABLET,DELAYED RELEASE
ORAL_TABLET | Freq: Every day | ORAL | 3 refills | 90.00000 days | Status: CP
Start: 2019-11-30 — End: 2020-11-29
  Filled 2019-12-12: qty 90, 90d supply, fill #0

## 2019-11-30 MED ORDER — PREDNISONE 20 MG TABLET
ORAL_TABLET | Freq: Every day | ORAL | 0 refills | 90.00000 days | Status: CP
Start: 2019-11-30 — End: 2020-02-28
  Filled 2019-12-12: qty 180, 90d supply, fill #0

## 2019-11-30 MED ORDER — SULFAMETHOXAZOLE 800 MG-TRIMETHOPRIM 160 MG TABLET
ORAL_TABLET | ORAL | 1 refills | 84 days | Status: CP
Start: 2019-11-30 — End: 2020-05-28
  Filled 2019-12-12: qty 36, 84d supply, fill #0

## 2019-11-30 MED ORDER — METFORMIN 1,000 MG TABLET
ORAL_TABLET | Freq: Two times a day (BID) | ORAL | 3 refills | 90.00000 days | Status: CP
Start: 2019-11-30 — End: 2020-11-29
  Filled 2019-12-06: qty 180, 90d supply, fill #0

## 2019-12-05 MED FILL — XARELTO 20 MG TABLET: 30 days supply | Qty: 30 | Fill #1 | Status: AC

## 2019-12-05 MED FILL — XARELTO 20 MG TABLET: ORAL | 30 days supply | Qty: 30 | Fill #1

## 2019-12-06 MED FILL — METFORMIN 1,000 MG TABLET: 90 days supply | Qty: 180 | Fill #0 | Status: AC

## 2019-12-11 DIAGNOSIS — Z79899 Other long term (current) drug therapy: Principal | ICD-10-CM

## 2019-12-11 DIAGNOSIS — E7800 Pure hypercholesterolemia, unspecified: Principal | ICD-10-CM

## 2019-12-11 DIAGNOSIS — H538 Other visual disturbances: Principal | ICD-10-CM

## 2019-12-11 DIAGNOSIS — R7309 Other abnormal glucose: Principal | ICD-10-CM

## 2019-12-11 DIAGNOSIS — E1165 Type 2 diabetes mellitus with hyperglycemia: Principal | ICD-10-CM

## 2019-12-11 DIAGNOSIS — I252 Old myocardial infarction: Principal | ICD-10-CM

## 2019-12-11 DIAGNOSIS — Z86711 Personal history of pulmonary embolism: Principal | ICD-10-CM

## 2019-12-11 DIAGNOSIS — Z7984 Long term (current) use of oral hypoglycemic drugs: Principal | ICD-10-CM

## 2019-12-11 DIAGNOSIS — T380X5A Adverse effect of glucocorticoids and synthetic analogues, initial encounter: Principal | ICD-10-CM

## 2019-12-11 DIAGNOSIS — R631 Polydipsia: Principal | ICD-10-CM

## 2019-12-11 DIAGNOSIS — R739 Hyperglycemia, unspecified: Principal | ICD-10-CM

## 2019-12-11 DIAGNOSIS — Z87891 Personal history of nicotine dependence: Principal | ICD-10-CM

## 2019-12-11 DIAGNOSIS — I10 Essential (primary) hypertension: Principal | ICD-10-CM

## 2019-12-12 ENCOUNTER — Encounter
Admit: 2019-12-12 | Discharge: 2019-12-12 | Disposition: A | Discharge status: Home / Self Care | Payer: PRIVATE HEALTH INSURANCE

## 2019-12-12 MED FILL — PREDNISONE 20 MG TABLET: 90 days supply | Qty: 180 | Fill #0 | Status: AC

## 2019-12-12 MED FILL — SULFAMETHOXAZOLE 800 MG-TRIMETHOPRIM 160 MG TABLET: 84 days supply | Qty: 36 | Fill #0 | Status: AC

## 2019-12-12 MED FILL — PANTOPRAZOLE 40 MG TABLET,DELAYED RELEASE: 90 days supply | Qty: 90 | Fill #0 | Status: AC

## 2019-12-13 MED ORDER — ON CALL EXPRESS LANCET 30 GAUGE
3 refills | 0.00000 days | Status: CP
Start: 2019-12-13 — End: ?
  Filled 2019-12-14: qty 100, 100d supply, fill #0

## 2019-12-13 MED ORDER — BLOOD-GLUCOSE METER KIT WRAPPER
0 refills | 0 days | Status: CP
Start: 2019-12-13 — End: 2020-12-12
  Filled 2019-12-14: qty 1, 30d supply, fill #0

## 2019-12-13 MED ORDER — CANAGLIFLOZIN 100 MG TABLET
ORAL_TABLET | Freq: Every day | ORAL | 11 refills | 30.00000 days | Status: CP
Start: 2019-12-13 — End: 2020-01-16
  Filled 2019-12-14: qty 30, 30d supply, fill #0

## 2019-12-13 MED ORDER — BLOOD GLUCOSE TEST STRIPS
Freq: Once | 1 refills | 0.00000 days | Status: CP
Start: 2019-12-13 — End: 2019-12-13

## 2019-12-13 MED ORDER — ON CALL EXPRESS TEST STRIP
Freq: Once | 1 refills | 0 days | Status: CP
Start: 2019-12-13 — End: 2020-01-19
  Filled 2019-12-14: qty 100, 100d supply, fill #0

## 2019-12-13 MED ORDER — BLOOD-GLUCOSE METER
0 refills | 0.00000 days | Status: CP
Start: 2019-12-13 — End: ?

## 2019-12-14 ENCOUNTER — Ambulatory Visit: Admit: 2019-12-14 | Discharge: 2019-12-15 | Payer: PRIVATE HEALTH INSURANCE

## 2019-12-14 DIAGNOSIS — Z7952 Long term (current) use of systemic steroids: Principal | ICD-10-CM

## 2019-12-14 DIAGNOSIS — I10 Essential (primary) hypertension: Principal | ICD-10-CM

## 2019-12-14 DIAGNOSIS — G4733 Obstructive sleep apnea (adult) (pediatric): Principal | ICD-10-CM

## 2019-12-14 DIAGNOSIS — E869 Volume depletion, unspecified: Principal | ICD-10-CM

## 2019-12-14 DIAGNOSIS — Z79899 Other long term (current) drug therapy: Principal | ICD-10-CM

## 2019-12-14 DIAGNOSIS — E1165 Type 2 diabetes mellitus with hyperglycemia: Principal | ICD-10-CM

## 2019-12-14 DIAGNOSIS — I472 Ventricular tachycardia: Principal | ICD-10-CM

## 2019-12-14 DIAGNOSIS — Z86711 Personal history of pulmonary embolism: Principal | ICD-10-CM

## 2019-12-14 DIAGNOSIS — Z87891 Personal history of nicotine dependence: Principal | ICD-10-CM

## 2019-12-14 DIAGNOSIS — I493 Ventricular premature depolarization: Principal | ICD-10-CM

## 2019-12-14 DIAGNOSIS — R42 Dizziness and giddiness: Principal | ICD-10-CM

## 2019-12-14 DIAGNOSIS — M1990 Unspecified osteoarthritis, unspecified site: Principal | ICD-10-CM

## 2019-12-14 DIAGNOSIS — D8685 Sarcoid myocarditis: Principal | ICD-10-CM

## 2019-12-14 DIAGNOSIS — Z7901 Long term (current) use of anticoagulants: Principal | ICD-10-CM

## 2019-12-14 DIAGNOSIS — Z7984 Long term (current) use of oral hypoglycemic drugs: Principal | ICD-10-CM

## 2019-12-14 DIAGNOSIS — E7800 Pure hypercholesterolemia, unspecified: Principal | ICD-10-CM

## 2019-12-14 DIAGNOSIS — I498 Other specified cardiac arrhythmias: Principal | ICD-10-CM

## 2019-12-14 DIAGNOSIS — R002 Palpitations: Principal | ICD-10-CM

## 2019-12-14 MED FILL — ON CALL EXPRESS TEST STRIP: 100 days supply | Qty: 100 | Fill #0 | Status: AC

## 2019-12-14 MED FILL — INVOKANA 100 MG TABLET: 30 days supply | Qty: 30 | Fill #0 | Status: AC

## 2019-12-14 MED FILL — ON CALL EXPRESS METER: 30 days supply | Qty: 1 | Fill #0 | Status: AC

## 2019-12-14 MED FILL — ON CALL LANCET 30 GAUGE: 100 days supply | Qty: 100 | Fill #0 | Status: AC

## 2019-12-26 ENCOUNTER — Ambulatory Visit: Admit: 2019-12-26 | Discharge: 2019-12-27

## 2019-12-26 DIAGNOSIS — I2699 Other pulmonary embolism without acute cor pulmonale: Principal | ICD-10-CM

## 2019-12-26 DIAGNOSIS — E1121 Type 2 diabetes mellitus with diabetic nephropathy: Principal | ICD-10-CM

## 2019-12-26 DIAGNOSIS — E1169 Type 2 diabetes mellitus with other specified complication: Principal | ICD-10-CM

## 2019-12-26 DIAGNOSIS — G4733 Obstructive sleep apnea (adult) (pediatric): Principal | ICD-10-CM

## 2019-12-26 MED ORDER — ROSUVASTATIN 10 MG TABLET
ORAL_TABLET | Freq: Every day | ORAL | 3 refills | 45 days | Status: CP
Start: 2019-12-26 — End: ?
  Filled 2020-01-19: qty 90, 45d supply, fill #0

## 2019-12-26 MED ORDER — DIPHTH,PERTUSSIS(ACEL),TETANUS 2.5 LF UNIT-8 MCG-5 LF/0.5ML IM SYRINGE
Freq: Once | INTRAMUSCULAR | 0 refills | 1 days | Status: CP
Start: 2019-12-26 — End: 2019-12-26

## 2019-12-26 MED ORDER — PEN NEEDLE, DIABETIC 29 GAUGE X 1/2" (12 MM)
11 refills | 0 days | Status: CP
Start: 2019-12-26 — End: 2020-12-25

## 2019-12-26 MED ORDER — INSULIN GLARGINE (U-100) 100 UNIT/ML SUBCUTANEOUS SOLUTION
Freq: Every day | SUBCUTANEOUS | 3 refills | 142.00000 days | Status: CP
Start: 2019-12-26 — End: 2019-12-27

## 2019-12-27 MED ORDER — INSULIN GLARGINE (U-100) 100 UNIT/ML (3 ML) SUBCUTANEOUS PEN
Freq: Every day | SUBCUTANEOUS | 11 refills | 214 days | Status: CP
Start: 2019-12-27 — End: 2020-12-26
  Filled 2019-12-28: qty 15, 140d supply, fill #0

## 2019-12-28 DIAGNOSIS — I10 Essential (primary) hypertension: Principal | ICD-10-CM

## 2019-12-28 DIAGNOSIS — M7062 Trochanteric bursitis, left hip: Principal | ICD-10-CM

## 2019-12-28 DIAGNOSIS — D869 Sarcoidosis, unspecified: Principal | ICD-10-CM

## 2019-12-28 MED ORDER — CANDESARTAN 16 MG TABLET
ORAL_TABLET | Freq: Every day | ORAL | 2 refills | 30 days | Status: CP
Start: 2019-12-28 — End: 2020-03-27
  Filled 2020-01-04: qty 30, 30d supply, fill #0

## 2019-12-28 MED FILL — TRUEPLUS PEN NEEDLE 29 GAUGE X 1/2" (12 MM): 100 days supply | Qty: 100 | Fill #0 | Status: AC

## 2019-12-28 MED FILL — LANTUS SOLOSTAR U-100 INSULIN 100 UNIT/ML (3 ML) SUBCUTANEOUS PEN: 140 days supply | Qty: 15 | Fill #0 | Status: AC

## 2019-12-28 MED FILL — TRUEPLUS PEN NEEDLE 29 GAUGE X 1/2" (12 MM): 100 days supply | Qty: 100 | Fill #0

## 2019-12-30 DIAGNOSIS — D869 Sarcoidosis, unspecified: Principal | ICD-10-CM

## 2019-12-30 DIAGNOSIS — M7062 Trochanteric bursitis, left hip: Principal | ICD-10-CM

## 2020-01-02 DIAGNOSIS — D869 Sarcoidosis, unspecified: Principal | ICD-10-CM

## 2020-01-02 DIAGNOSIS — M7062 Trochanteric bursitis, left hip: Principal | ICD-10-CM

## 2020-01-02 MED ORDER — DICLOFENAC 1 % TOPICAL GEL
Freq: Four times a day (QID) | TOPICAL | 0 refills | 13 days | Status: CP
Start: 2020-01-02 — End: 2021-01-01
  Filled 2020-01-04: qty 100, 13d supply, fill #0

## 2020-01-04 MED FILL — CANDESARTAN 16 MG TABLET: 30 days supply | Qty: 30 | Fill #0 | Status: AC

## 2020-01-04 MED FILL — DICLOFENAC 1 % TOPICAL GEL: 13 days supply | Qty: 100 | Fill #0 | Status: AC

## 2020-01-05 MED FILL — METOPROLOL SUCCINATE ER 100 MG TABLET,EXTENDED RELEASE 24 HR: 90 days supply | Qty: 135 | Fill #0 | Status: AC

## 2020-01-12 ENCOUNTER — Ambulatory Visit: Admit: 2020-01-12 | Discharge: 2020-01-13 | Attending: Adult Health | Primary: Adult Health

## 2020-01-12 DIAGNOSIS — I472 Ventricular tachycardia: Principal | ICD-10-CM

## 2020-01-12 DIAGNOSIS — D869 Sarcoidosis, unspecified: Principal | ICD-10-CM

## 2020-01-12 MED ORDER — METOPROLOL SUCCINATE ER 200 MG TABLET,EXTENDED RELEASE 24 HR
ORAL_TABLET | Freq: Every evening | ORAL | 3 refills | 90 days | Status: CP
Start: 2020-01-12 — End: 2021-01-11
  Filled 2020-03-12: qty 90, 90d supply, fill #0

## 2020-01-16 ENCOUNTER — Institutional Professional Consult (permissible substitution): Admit: 2020-01-16 | Discharge: 2020-01-17 | Attending: Pharmacist | Primary: Pharmacist

## 2020-01-16 DIAGNOSIS — E1169 Type 2 diabetes mellitus with other specified complication: Principal | ICD-10-CM

## 2020-01-16 MED ORDER — CANAGLIFLOZIN 300 MG TABLET
ORAL_TABLET | Freq: Every morning | ORAL | 3 refills | 90.00000 days | Status: CP
Start: 2020-01-16 — End: ?
  Filled 2020-01-16: qty 30, 30d supply, fill #0

## 2020-01-16 MED FILL — INVOKANA 300 MG TABLET: 30 days supply | Qty: 30 | Fill #0 | Status: AC

## 2020-01-16 MED FILL — XARELTO 20 MG TABLET: ORAL | 90 days supply | Qty: 90 | Fill #2

## 2020-01-16 MED FILL — XARELTO 20 MG TABLET: 90 days supply | Qty: 90 | Fill #2 | Status: AC

## 2020-01-19 MED ORDER — ON CALL EXPRESS TEST STRIP
Freq: Once | 1 refills | 0.00000 days | Status: CP
Start: 2020-01-19 — End: 2020-01-22

## 2020-01-19 MED FILL — ROSUVASTATIN 10 MG TABLET: 45 days supply | Qty: 90 | Fill #0 | Status: AC

## 2020-01-20 ENCOUNTER — Ambulatory Visit: Admit: 2020-01-20 | Discharge: 2020-01-21

## 2020-01-20 DIAGNOSIS — R0600 Dyspnea, unspecified: Principal | ICD-10-CM

## 2020-01-20 DIAGNOSIS — G4733 Obstructive sleep apnea (adult) (pediatric): Principal | ICD-10-CM

## 2020-01-20 DIAGNOSIS — I2699 Other pulmonary embolism without acute cor pulmonale: Principal | ICD-10-CM

## 2020-01-20 DIAGNOSIS — D869 Sarcoidosis, unspecified: Principal | ICD-10-CM

## 2020-01-23 MED ORDER — BLOOD GLUCOSE TEST STRIPS
ORAL_STRIP | 11 refills | 0 days | Status: CP
Start: 2020-01-23 — End: 2021-01-22

## 2020-01-23 MED ORDER — ON CALL EXPRESS TEST STRIP
11 refills | 0.00000 days | Status: CP
Start: 2020-01-23 — End: ?
  Filled 2020-01-24: qty 300, 100d supply, fill #0

## 2020-01-31 ENCOUNTER — Institutional Professional Consult (permissible substitution): Admit: 2020-01-31 | Discharge: 2020-02-01 | Attending: Pharmacist | Primary: Pharmacist

## 2020-01-31 DIAGNOSIS — E1121 Type 2 diabetes mellitus with diabetic nephropathy: Principal | ICD-10-CM

## 2020-01-31 DIAGNOSIS — I10 Essential (primary) hypertension: Principal | ICD-10-CM

## 2020-02-22 MED FILL — INVOKANA 300 MG TABLET: ORAL | 30 days supply | Qty: 30 | Fill #0

## 2020-02-22 MED FILL — CANDESARTAN 16 MG TABLET: ORAL | 30 days supply | Qty: 30 | Fill #1

## 2020-02-27 MED FILL — METFORMIN 1,000 MG TABLET: ORAL | 90 days supply | Qty: 180 | Fill #1

## 2020-02-28 ENCOUNTER — Ambulatory Visit: Admit: 2020-02-28 | Discharge: 2020-02-29 | Payer: PRIVATE HEALTH INSURANCE

## 2020-02-28 DIAGNOSIS — D8685 Sarcoid myocarditis: Principal | ICD-10-CM

## 2020-02-28 DIAGNOSIS — I472 Ventricular tachycardia: Principal | ICD-10-CM

## 2020-02-28 DIAGNOSIS — D869 Sarcoidosis, unspecified: Principal | ICD-10-CM

## 2020-02-28 DIAGNOSIS — R002 Palpitations: Principal | ICD-10-CM

## 2020-03-15 ENCOUNTER — Ambulatory Visit: Admit: 2020-03-15 | Discharge: 2020-03-15 | Attending: Cardiovascular Disease | Primary: Cardiovascular Disease

## 2020-03-15 ENCOUNTER — Institutional Professional Consult (permissible substitution): Admit: 2020-03-15 | Discharge: 2020-03-15

## 2020-03-15 DIAGNOSIS — I472 Ventricular tachycardia: Principal | ICD-10-CM

## 2020-03-15 DIAGNOSIS — R002 Palpitations: Principal | ICD-10-CM

## 2020-04-02 DIAGNOSIS — R42 Dizziness and giddiness: Principal | ICD-10-CM

## 2020-04-02 DIAGNOSIS — D8685 Sarcoid myocarditis: Principal | ICD-10-CM

## 2020-04-03 ENCOUNTER — Ambulatory Visit: Admit: 2020-04-03 | Discharge: 2020-04-04 | Attending: Cardiovascular Disease | Primary: Cardiovascular Disease

## 2020-04-03 DIAGNOSIS — D8685 Sarcoid myocarditis: Principal | ICD-10-CM

## 2020-04-03 DIAGNOSIS — I472 Ventricular tachycardia: Principal | ICD-10-CM

## 2020-04-03 DIAGNOSIS — I493 Ventricular premature depolarization: Principal | ICD-10-CM

## 2020-04-03 DIAGNOSIS — R002 Palpitations: Principal | ICD-10-CM

## 2020-04-20 MED FILL — ROSUVASTATIN 10 MG TABLET: ORAL | 45 days supply | Qty: 90 | Fill #1

## 2020-04-23 ENCOUNTER — Institutional Professional Consult (permissible substitution): Admit: 2020-04-23 | Discharge: 2020-04-24 | Payer: PRIVATE HEALTH INSURANCE

## 2020-04-23 DIAGNOSIS — D8685 Sarcoid myocarditis: Principal | ICD-10-CM

## 2020-04-23 DIAGNOSIS — R002 Palpitations: Principal | ICD-10-CM

## 2020-04-23 DIAGNOSIS — I493 Ventricular premature depolarization: Principal | ICD-10-CM

## 2020-05-03 ENCOUNTER — Ambulatory Visit
Admit: 2020-05-03 | Discharge: 2020-05-04 | Payer: PRIVATE HEALTH INSURANCE | Attending: Student in an Organized Health Care Education/Training Program | Primary: Student in an Organized Health Care Education/Training Program

## 2020-05-03 ENCOUNTER — Ambulatory Visit: Admit: 2020-05-03 | Discharge: 2020-05-04 | Payer: PRIVATE HEALTH INSURANCE

## 2020-05-03 DIAGNOSIS — R509 Fever, unspecified: Principal | ICD-10-CM

## 2020-05-03 DIAGNOSIS — R002 Palpitations: Principal | ICD-10-CM

## 2020-05-14 ENCOUNTER — Institutional Professional Consult (permissible substitution): Admit: 2020-05-14 | Discharge: 2020-05-15 | Payer: PRIVATE HEALTH INSURANCE

## 2020-05-14 MED ORDER — EMPAGLIFLOZIN 10 MG TABLET
ORAL_TABLET | Freq: Every morning | ORAL | 3 refills | 30 days | Status: CP
Start: 2020-05-14 — End: ?
  Filled 2020-05-22: qty 30, 30d supply, fill #0

## 2020-05-14 MED ORDER — LOSARTAN 50 MG TABLET
ORAL_TABLET | Freq: Every day | ORAL | 3 refills | 90.00000 days | Status: CP
Start: 2020-05-14 — End: 2021-05-14
  Filled 2020-05-22: qty 90, 90d supply, fill #0

## 2020-05-15 MED FILL — XARELTO 20 MG TABLET: ORAL | 90 days supply | Qty: 90 | Fill #3

## 2020-05-18 DIAGNOSIS — E1100 Type 2 diabetes mellitus with hyperosmolarity without nonketotic hyperglycemic-hyperosmolar coma (NKHHC): Principal | ICD-10-CM

## 2020-06-04 ENCOUNTER — Ambulatory Visit
Admit: 2020-06-04 | Discharge: 2020-06-05 | Payer: PRIVATE HEALTH INSURANCE | Attending: Adult Health | Primary: Adult Health

## 2020-06-04 DIAGNOSIS — I493 Ventricular premature depolarization: Principal | ICD-10-CM

## 2020-06-04 DIAGNOSIS — I472 Ventricular tachycardia: Principal | ICD-10-CM

## 2020-06-04 DIAGNOSIS — D8685 Sarcoid myocarditis: Principal | ICD-10-CM

## 2020-06-04 DIAGNOSIS — E1100 Type 2 diabetes mellitus with hyperosmolarity without nonketotic hyperglycemic-hyperosmolar coma (NKHHC): Principal | ICD-10-CM

## 2020-06-04 DIAGNOSIS — I10 Essential (primary) hypertension: Principal | ICD-10-CM

## 2020-06-14 MED FILL — ROSUVASTATIN 10 MG TABLET: ORAL | 45 days supply | Qty: 90 | Fill #2

## 2020-06-20 MED FILL — METFORMIN 1,000 MG TABLET: ORAL | 90 days supply | Qty: 180 | Fill #2

## 2020-06-25 ENCOUNTER — Ambulatory Visit: Admit: 2020-06-25 | Discharge: 2020-06-26 | Payer: PRIVATE HEALTH INSURANCE

## 2020-06-25 DIAGNOSIS — E1121 Type 2 diabetes mellitus with diabetic nephropathy: Principal | ICD-10-CM

## 2020-06-25 DIAGNOSIS — G4733 Obstructive sleep apnea (adult) (pediatric): Principal | ICD-10-CM

## 2020-06-25 DIAGNOSIS — I10 Essential (primary) hypertension: Principal | ICD-10-CM

## 2020-06-25 MED ORDER — LOSARTAN 50 MG TABLET
ORAL_TABLET | Freq: Every day | ORAL | 3 refills | 45 days | Status: CP
Start: 2020-06-25 — End: 2020-12-22
  Filled 2020-06-29: qty 90, 45d supply, fill #0

## 2020-06-25 NOTE — Unmapped (Addendum)
Internal Medicine Clinic Visit    Reason for visit: Diabetes and HTN Management    A/P:           1. OSA (obstructive sleep apnea)    2. Type 2 diabetes mellitus with diabetic nephropathy, without long-term current use of insulin (CMS-HCC)    3. Hypertension, essential        1. T2DM - Steroid-Induced Hyperglycemia:  Is previously had very high blood sugars in the setting of increased prednisone for suspected cardiac sarcoidosis.  Hemoglobin A1c is 6.9% today, improved from 10.8% in 12/21 following decreasing prednisone and continued SGLT2 administration.  Transition from Invokana to [place] due to insurance issues at last visit, has been taking without issue. His last urine albumin/creatinine ratio was also elevated to 49.1 last year, increase losartan as below and we will recheck urine albumin/creatinine ratio at next visit to monitor response.  Has also made significant lifestyle changes with decrease sugar intake and increase physical activity  -Continue Januvia 10 mg daily, can consider increasing at next visit if A1c is elevated above goal  -Continue Metformin 1 g twice daily   -Continue to decrease sugar and simple carb intake, regular exercise  -Follow-up in 6 months for A1c check, urine albumin creatinine ratio  -Uptitrating losartan as below  ??  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 Cherly Hensen at [facility] cardiology. ??Robert Howell has long history of PVCs (since 2003) with negative EP study in 2003.  Echo and EKG normal in past and again 11/01/19, with normal BNP and proBNP levels and normal troponin levels since 2018.??Given his confirmed diagnosis of sarcoidosis confirmed by skin biopsy previously, there has been concern cardiac sarcoidosis is contributing to his PVCs.  Has been intermittently on and off high-dose prednisone without any symptomatic improvement of PVCs.  Ultimately felt that sarcoidosis may not be contributing to his arrhythmia.  Has been seeing Dr. Kathi Ludwig for consideration of ventricular ablation with plans currently in place to complete this in July  - Follows with Dr. Cherly Hensen of [facility]  -??Continue??Prednisone 10mg  daily   -??Continue??metoprolol XL??200 mg nightly  -Currently planning ablation with Dr. Kathi Ludwig in July  ??  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 Ala Dach of [facility]monology. Has previously underwent a PA gram that was indicative of chronic PE but normal PA hemodynamics without any indications of CTE disease that would benefit from intervention. Also had PFTs completed in 02/2019 that were improved from prior, suggesting pulmonary sarcoid is currently stable.  -??Continue to follow w/ Dr Ala Dach  -??Continue??Xarelto 20 mg daily??indefinitely  ??  4.??????OSA:??Patient has previously had a CPAP titration in 2015 showing good response to CPAP at 9 cm H2O. He had good benefit from CPAP and wore the CPAP nightly, but now mask is too large for face (he lost some weight).  He would benefit from a new mask and potentially a new titration study. In the mean time, we will order a new machine.   -Order placed for new APAP machine, will have patient return with machine at next visit in 6 months  -??Encouraged exercise as tolerated  ??  5.??Hyperlipidemia: LDL 175, total cholesterol 265 on 11/01/19.   Following increase in rosuvastatin, had lipid panel completed by cardiologist which showed better control with LDL of 81 and total cholesterol of 148.   -Continue??rosuvastatin to 20 mg daily  ??  6. HTN:??Previously on candesartan, however insurance stopped covering it and transition to losartan.  BP is mildly  elevated at 135/81 today on average, given prior proteinuria, will increase losartan to maximum dosing  -Increase losartan to 100 mg daily  - Continue metoprolol 200 mg nightly for PVCs as noted above    6. Prior Hx of Falls Risk  Has previously been documented as a fall risk when having palpitations, but has not had a fall and is unsure the restroom currently (see below)    Falls Assessment Review    Fall Risk Assessment was positive.  Falls risk interventions taken today were:  ?? Provider determined patient is not at risk for falls    ??  7. Health Maintenance:?? Has completed primary COVID vaccination with series Moderna.    Amenable to receiving booster today as he is now eligible.  Also discussed Shingrix vaccination and he will get this at local pharmacy  -COVID booster today  -Advised to receive Shingrix vaccination at local pharmacy     Return in about 6 months (around 12/25/2020) for In-person.    Staffed with Dr. Irena Cords, discussed    __________________________________________________________    HPI:    Robert Howell is a 55 year old male with a past medical history of hypertension, type 2 diabetes, pulmonary sarcoidosis, PVCs for potentially secondary to cardiac sarcoidosis, Osa, nephrolithiasis, hyperlipidemia, and pulmonary malaise him who presents today for follow-up regarding management of hypertension and diabetes.    Continues to have palpitations several times a week.  Symptoms are relatively unchanged, steroids did not help. Occasionally has chest pain still, once a week but this goes away quickly and is only associated with palpitations. Mood has been the same, no changes at home.     Has been staying away from sweets and exercising in the yard much more.  There is that he had lost some weight but feels he is already gained some of it back.    Currently on pred 10 mg, sugars have been 110-130 at home.  Not taking blood pressures at home.        __________________________________________________________    Problem List:  Patient Active Problem List   Diagnosis   ??? Pulmonary embolism and infarction (CMS-HCC)   ??? Sarcoid   ??? OSA (obstructive sleep apnea)   ??? Tobacco abuse   ??? Nephrolithiasis   ??? Ventral incisional hernia   ??? Bilateral carpal tunnel syndrome   ??? Ventricular tachycardia (CMS-HCC)   ??? Type 2 diabetes mellitus (CMS-HCC)   ??? Hypertension, essential   ??? Pure hypercholesterolemia   ??? Dizziness   ??? Palpitations   ??? Bigeminy   ??? PVC (premature ventricular contraction)       Medications:  Reviewed in EPIC  __________________________________________________________    Physical Exam:   Vital Signs:  Vitals:    06/25/20 0834   BP: 135/81   Pulse: 68   Resp: 18   Temp: 36.7 ??C   TempSrc: Temporal   SpO2: 96%   Weight: (!) 116.2 kg (256 lb 3.2 oz)   Height: 182.9 cm (6')          Gen: Well appearing, friendly middle-aged male in NAD  CV: Regular rate with occasional early beats, no murmurs/rubs/gallops, 2+ radial pulses  Pulm: CTA bilaterally, no crackles or wheezes  Abd: Soft, NTND, normal BS. No HSM.  Ext: No edema  Skin: No lesions on clothed exam  Psych: Upbeat, jovial mood and affect      Medication adherence and barriers to the treatment plan have been addressed. Opportunities to optimize healthy behaviors have been discussed. Patient /  caregiver voiced understanding.

## 2020-06-29 MED FILL — JARDIANCE 10 MG TABLET: ORAL | 30 days supply | Qty: 30 | Fill #1

## 2020-06-29 MED FILL — METOPROLOL SUCCINATE ER 200 MG TABLET,EXTENDED RELEASE 24 HR: ORAL | 90 days supply | Qty: 90 | Fill #1

## 2020-07-23 ENCOUNTER — Ambulatory Visit: Admit: 2020-07-23 | Payer: PRIVATE HEALTH INSURANCE | Attending: Adult Health | Primary: Adult Health

## 2020-08-22 MED ORDER — FLUTICASONE PROPIONATE 50 MCG/ACTUATION NASAL SPRAY,SUSPENSION
Freq: Every day | NASAL | 0 refills | 60.00000 days | Status: CP | PRN
Start: 2020-08-22 — End: ?
  Filled 2020-10-25: qty 16, 34d supply, fill #0

## 2020-08-22 MED ORDER — ALBUTEROL SULFATE HFA 90 MCG/ACTUATION AEROSOL INHALER
Freq: Four times a day (QID) | RESPIRATORY_TRACT | 9 refills | 16 days | Status: CP | PRN
Start: 2020-08-22 — End: ?

## 2020-08-23 MED FILL — ROSUVASTATIN 10 MG TABLET: ORAL | 45 days supply | Qty: 90 | Fill #3

## 2020-08-23 MED FILL — JARDIANCE 10 MG TABLET: ORAL | 30 days supply | Qty: 30 | Fill #2

## 2020-09-18 ENCOUNTER — Ambulatory Visit: Admit: 2020-09-18 | Discharge: 2020-09-19 | Payer: PRIVATE HEALTH INSURANCE

## 2020-09-18 ENCOUNTER — Ambulatory Visit
Admit: 2020-09-18 | Discharge: 2020-09-19 | Payer: PRIVATE HEALTH INSURANCE | Attending: Cardiovascular Disease | Primary: Cardiovascular Disease

## 2020-09-18 DIAGNOSIS — D869 Sarcoidosis, unspecified: Principal | ICD-10-CM

## 2020-09-18 MED ORDER — RIVAROXABAN 20 MG TABLET
ORAL_TABLET | Freq: Every day | ORAL | 3 refills | 90.00000 days | Status: CP
Start: 2020-09-18 — End: 2021-09-18
  Filled 2020-09-24: qty 90, 90d supply, fill #0

## 2020-09-18 MED ORDER — PREDNISONE 2.5 MG TABLET
ORAL_TABLET | ORAL | 0 refills | 90.00000 days | Status: CP
Start: 2020-09-18 — End: 2020-12-17
  Filled 2020-09-24: qty 90, 30d supply, fill #0

## 2020-09-18 MED ORDER — MYCOPHENOLATE MOFETIL 500 MG TABLET
ORAL_TABLET | Freq: Two times a day (BID) | ORAL | 3 refills | 90 days | Status: CP
Start: 2020-09-18 — End: 2021-09-18

## 2020-09-18 NOTE — Unmapped (Addendum)
[facility] Note    Referring Provider: Malachy Moan, MD  Montez Hageman, AGNP  [address]  [address]  [place],  [place] [address]     Primary Provider: Orville Govern, MD  [address]  [place] [address]   Other Providers:    Dr. Eppie Gibson (Cardiology)  Dr. Krystal Clark (Rheumatology)  Dr. Lattie Haw (Pulmonology)  Previously: Dr. Nelson Chimes (pulmonary), Dr. Janece Canterbury (rheumatology)    Reason for Visit:  AARON BOSTWICK is a 55 y.o. 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    Assessment & Plan:  1.  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 Anguiano has long history of PVCs (since 2003) with negative EP study in 2003.  His primary cardiac symptoms are chest pains (atypical) and palpitations (especially supine).  On his ambulatory heart rate monitor in 03/2019, he had 9.5% VE/PVC burden and one episode of ventricular tachycardia lasting greater than 30 seconds associated with palpitations.  Reassuringly, the ventricular tachycardia rate is somewhat slow at approximately 140 bpm and he has not experienced syncope previously over the last 2 years when he began feeling palpitations. 7 day Ziopatch from 3/3-3/11/22 with very minimal VT (max run 4 beats) but significant PVC burden (25%); has follow-up with EP for consideration of ablation. Echo and EKG normal in past and again 11/01/19, with normal BNP and proBNP levels and normal troponin levels since 2018.  Given conflicting imaging results including a myocardial perfusion scan with active inflammation (+ in 07/2019, but negative in 2019, 2005, 2002), with past and recent MRI without signs of scarring or sarcoid, cardiac sarcoidosis could not be confirmed. However, given that the disease may wax and wane, and the completion of 10 days of steroids prior to his last MRI, cardiac sarcoidosis remained concerning etiology for his symptoms. Notably, patient reported self-tapering from Pred 40mg  in November 2021 down by 10mg  every few weeks until stopping Pred and Bactrim 03/12/20 (as opposed to plan for Pred 40mg  pulse for 3 months followed by taper). Given persistent palpitations, presentation remains concerning for cardiac sarcoid.  Thus prednisone 10 mg/d restarted 04/03/20 (see Immunosuppression history below).  - Had previously seen EP (Dr. Kathi Ludwig) in May 2022 with plans for PVC ablation over the summer 2022; however, decided against the procedure due to personal reasons.  Of note, repeat ziopatch noted minimal improvement in PVC burden (25% to 21%) despite resumption of Pred 10 daily since Mar 2022.  -- Starting steroid-sparing immunosuppression with Cellcept 500 bid with steroid taper (Pred 7.5mg  daily now x1 month, 5mg  x1 month, 2.5mg  x1 month, off), continue PPI  -- Will repeat Cardiac PET following completion of steroid taper  -- Repeat ZioPatch x14d while adjusting immunosuppression to quantify PVC burden; also with scheduled EP visit Derwood Kaplan, Dr. Kathi Ludwig) in Oct 2022 to help guide moving forward with PVC ablation  -- Labs for today with CBC, BNP  -- Continue Metoprolol Succ 200 daily    2. Hypertension: Currently on losartan 100 daily  - BP: 148/88    - Started on ARB in 2020 for BP, on BB in 2021 because of Ziopatch findings/arrhythmia.  - continue  current regimen    4. Hyperlipidemia  - Now on Crestor 20mg  daily, tolerating well  Lab Results   Component Value Date    LDL 81 06/04/2020   -The 10-year ASCVD risk score Denman George DC Jr., et al., 2013) is: 22.1%    4. Type 2 diabetes on oral therapy:   Lab Results   Component Value Date    A1C 6.9 06/25/2020   - on metformin 1000mg  BID, insulin per PCP  - continue SGLT2i per PCP, consider increasing to diabetic dosing (25mg  daily)    4. 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  - On Xarelto indefinitely, refill provided    5. OSA, Obesity.   - Encouraged nightly use of CPAP and increase exercise as tolerated     6. Anxiety: GAD-7 of 3 today with minimal anxiety symptoms. Previous hx of significant anxiety related to cardiac symptoms. If persistent history of back pain, could consider SNRI.  - consider SNRI if anxiety worsens  ??  7.  Need for PCP/Healthcare Maintenance:   - Now following with IM    Follow-up:    Return in about 4 months (around 01/18/2021). with Dr. Dian Queen Denny Peon, MD, MS  Advanced Heart Failure and Transplant Fellow  [facility] Division of Cardiology    Attestation:  I saw the patient with the Fellow/Subspecialty Resident Dr. Chrissie Noa ([place]) [place]  I discussed the findings, assessment and plan with the Fellow and agree with the findings and plan as documented in the Fellow's note.  Patient overall doing well, appears euvolemic on exam, NYHA I, but still notes palpitations which he thinks are unchanged, and exam notable for ~8-9 PVC in 30 sec, every 2-5 beats.  Since he remains on Prednisone, plan to add MMF 500 mg BID and wean Pred 2.5 q14month, and repeat ZioPatch again now to compare to previous, with goal to repeat PET when off Prednisone and potentially repeat ZioPatch depending on whether he proceeds with PVC ablation in the future.  We reassured him that his recovery post-PVC ablation should be relatively quick, and encouraged him to formally follow-up with EP in 1 month after ZioPatch results are in.  Since he is starting Mycophenolate (MMF), baseline CBC shows normal WBC.   Will need to repeat his WBC on MMF in 1-2 weeks, then 4-6 weeks thereafter to confirm stability.     - Joya Gaskins, MD      History of Present Illness:  GREGOIRE BENNIS was first seen by 11/01/19 by Dr. Cherly Hensen for formal consultation and evaluation of potential cardiac sarcoidosis, manifesting as PVCs and nonsustained VT.  He is a 55 y.o. male with a history of sarcoidosis with pulmonary and cutaneous involvement, OSA, HTN, diverticulosis with perforation requiring temporary colostomy, type 2 DM, prior tobacco use, and DVT/PE on rivaroxaban.  There was concern for cardiac involvement in the past, with negative EP study in 2003 and various other tests.  He saw Dr. Andrey Farmer 03/24/2019 (first [facility] cardiology visit) who referred him to Dr. Kathi Ludwig (08/08/19, first visit) for wide complex tachycardia noted on ambulatory monitoring, with frequent follow-up with NP Derwood Kaplan thereafter.   Mr. Klippel' history of sarcoid started in 05/1998 when a skin lesion taken from his back was biopsied and sarcoid was confirmed given the presence of non-necrotizing granulomatous inflammation with focal central necrosis.  He was subsequently started on prednisone.  Given dyspnea on exertion at that time, spirometry and chest CT were completed.  Spirometry was significant for reduced DLCO and chest CT showed hilar adenopathy and diffuse interstitial radiodensities concerning for pulmonary sarcoidosis.  Concern for cardiac involvement was noted as early as 11/2000, when his EKGs showed multiple PVCs from 2 separate ventricular sites concerning for cardiac sarcoidosis.  A thallium study was done to evaluate for substrate causing his ectopy, and showed a single intraseptal defect from the midportion of the left ventricle to the base with borderline washout that would be atypical for cardiac sarcoid presentation. He also had an EP study in 01/2001 without any inducible VT (with EP Dr. Frederick Peers at [facility]).  Thereafter he had sporadic cardiology clinic visits thereafter; e.g., 12/13/2003 with Dr. Viviann Spare Filby/Dr. Christella Noa for post-hospitalization follow-up of chest pain in 06/2003, then none until 2021.  He was diagnosed with and treated for hypertension and type 2 diabetes after 2005. He has had multiple [facility] pulmonary providers since 2001, including Dr. Nelson Chimes, then Dr. Shirlean Kelly.   He still follows with Dr. Ala Dach in pulmonary clinic for management of his pulmonary sarcoidosis, which does not appear to be currently active.  In 2019, Dr. Ala Dach ordered a cardiac PET scan because of irregular heart rhythm/bigeminy, which was negative for cardiac sarcoid (05/2017), and he later referred him to cardiology in 2021.   From a cardiac symptom standpoint, Mr. Metts shares that he has experienced worsening intermittent episodes of palpitations with dull, central chest pain, lightheadedness, and dyspnea for the past 4 to 5 years. He was seen in the [facility] ED in June 2020 with episodes of chest discomfort, palpitations, and dyspnea. He underwent workup including CTA (06/2018: no PE), echo which was reassuring (12/2018), and ambulatory monitor which demonstrated 3.1% PVC burden. He was referred by Dr. Ala Dach to cardiology Dr. Andrey Farmer for further evaluation (03/2019).  Repeat ambulatory monitor in 03/2019 was remarkable for 25 episodes of wide complex tachycardia, the longest of which was 31.6 seconds at a rate of 136 bpm. He underwent cardiac MRI in April 2021 which was negative for sarcoid. Ziopatch at this time showed 9.5% isolated VEs and a 31.6 second run of VT at 136bpm.  He was thereafter referred to EP Dr. Kathi Ludwig who ordered cardiac MRI (04/2019: negative for sarcoid).  Mr. Murgia was started on metoprolol succinate 25mg  nightly in June and noticed a slight decrease in his palpitations. At his visit 08/08/19, his metoprolol was increased to 50mg  nightly. He was scheduled for PET myocardial perfusion scan, which he underwent 09/06/19. The scan suggested evidence of active cardiac sarcoid. He then presented to the ED 9/16 for a 20 min episode of dizziness, lightheadedness, chest tightness, diaphoresis, and palpitations. He was seen in follow-up on 9/17 by NP Derwood Kaplan and was started on steroids (Prednisone 40 mg/d) for suspected sarcoid with concerning pre-syncopal symptoms. Following that visit, repeat cardiac MRI was ordered due to discrepancy in results of 04/2019 MRI and 08/2019 PET, to further clarify diagnosis of cardiac sarcoid. He took Prednisone 40mg  from 9/18-9/29 and then held until MRI 10/6. MRI 10/19/19 was negative for sarcoid.    Summary of recent visits/events:  - 10/20/19 Concha Se NP): metoprolol XR increased to 100 mg daily. decrease in palpitations and chest pain since starting metoprolol, but no   additional symptomatic benefit with subsequent increases in dose. Symptoms were not improved while taking the prednisone.    - 11/01/19 (Dr  Chang): Repeat Ziopatch. Metoprolol increased to 150mg .   - 11/30/19 (EBaker):  Tolerating metoprolol 150mg , but without any improvement in his palpitations. He still has frequent palpitations and occasional episodes of lightheadedness. Metoprolol increased to 200mg . Started Prednisone 40mg  daily, Bactrim MWF.  -01/12/20 (E. Baker): Presented for follow-up on prednisone. New insulin-dependent DM. Was on pred 40mg  daily, plan to continue for 3 months with taper TBD. Persistent palpitations without improvement, lasting about 15 mins; dizziness, chest tightness, but did not feel like he was going to pass out. Metoprolol increased to 200mg  every day.  - 04/03/20 (Dr. Cherly Hensen): restart steroid taper with repeat ziopatch to quantify PVC, f/u with EP re: PVC ablation  - 06/04/20 Concha Se): plan for PVC ablation    Interval History  Since last clinic visit, was evaluated by EP in May 2022 with plans for PVC ablation over the summer, however, patient elected against proceeding with procedure (was worried about procedure and recovery time).  Has continued on prednisone 10 mg daily.  Repeat Zio patch in April 2022, noted marginal improvement in PVC burden, 25% to 21%.  Continues to have palpitations, daily that is unchanged from prior clinic visits, also has associated lightheadedness.  Otherwise, no other acute symptoms.  Denies significant DOE, SOB, lower extremity swelling, significant weight changes.  Does report exertional intolerance due to palpitations.  Denies syncope, chest pain.  No significant change in symptoms over the past several months.       Cardiovascular History & Procedures:    Cardiovascular History and pertinent PMH  ?? Frequent PVCs with VT  ?? Sarcoidosis with pulmonary and cutaneous involvement, ?cardiac involvement  ?? DVT/PE on rivaroxaban (chronic PE per PA angiogram)  ?? OSA         Cath / PCI:  ?? RHC 02/16/2012 -  Hemodynamics - normal filling pressures, pulmonary pressures, cardiac output.  From Dr. Marily Lente note:  State: AIR REST  Right Atrium (mean): 5  Right Ventricle (s/ed): 25 / 2  Pulmonary Artery (s/d): 27 / 10  Pulmonary Artery (mean): 18  P C W (mean): 5  Thermal Cardiac Output: 6.21 L/min  O2 Consumption: 305.9 ml/min  O2 Consumption Index:133  O2 Consumption Method:Assumed  Arterial Saturation: 98 %  Pulmonary Artery Saturation: 75 %  Heart Rate: 61 bpm  A-V O2 Difference:46.92 ml/dl  Fick Output: 1.61 L/min  Cardiac Output used in Calcs: Fick  Pulmonary Vascular: 1.99 Wood Units  Hemodynamic Comments: thermal index = 2.71  fick index = 2.83  ?? No LHC    CV Surgery:  ??  none    EP Procedures and Devices:  ?? 02/08/2001 EP Study [facility] Dr. Anabel Halon):  Negative for inducible arrhythmias  ?? 04/12/2003 Holter:  Multifocal PVCs accounted for 3.2% of beats.  Occasional bigeminy, trigeminy, and couplets noted.  There were two 3 beat runs of slow (120-140 bpm) polymorphic VT -- one at 6:21 AM on day 1 and one at 7:10 AM on day 2.  ?? 03/2019 ZioPatch:    Analysis time: 13 days, 8 hours (03/24/19-04/07/23)  Patient had a min HR of 58 bpm, max HR of 231 bpm, and avg HR of 86 bpm.   Predominant underlying rhythm was Sinus Rhythm.   25 Ventricular Tachycardia runs occurred, the run with the fastest interval lasting 8 beats with a max rate of 231 bpm, the longest lasting 31.6 secs with an avg rate of 136 bpm. Ventricular Tachycardia was detected within +/- 45 seconds of symptomatic patient event(s).  Isolated SVEs were rare (<1.0%), and no SVE Couplets or SVE Triplets were present. Isolated VEs were frequent (9.5%, [id number]), VE Couplets were occasional (1.9%, 15214), and VE Triplets were rare (<1.0%, 24). Ventricular Bigeminy and Trigeminy were present.   ?? 10/2019 ZioPatch:  Patient had a min HR of 23 bpm, max HR of 182 bpm, and avg HR of 76 bpm. Predominant underlying rhythm was Sinus Rhythm. 15 Ventricular Tachycardia runs occurred, the run with the fastest interval lasting 4 beats with a max rate of 182 bpm, the longest lasting 19.6 secs with an avg rate of 158 bpm. Second Degree AV Block-Mobitz I (Wenckebach) was present. Isolated SVEs were rare (<1.0%), and no SVE Couplets or SVE Triplets were present. Isolated VEs were frequent (14.2%, [id number]), VE Couplets were occasional (1.3%, 4459), and VE Triplets were rare (<1.0%, 100). Ventricular Bigeminy and Trigeminy were present. ?? 8 patient triggered events were associated with sinus rhythm and on occasion with sinus rhythm and isolated VEs. ?? ??Mobitz 1 AV block occurred with sinus slowing during normal sleep hours.   ?? 3/3-3/11/2020 ZioPatch:   Patient had a min HR of 51 bpm, max HR of 167 bpm, and avg HR of 73 bpm. Predominant underlying rhythm was Sinus Rhythm. 6 Ventricular Tachycardia runs occurred, the run with the fastest interval lasting 4 beats with a max rate of 167 bpm, the longest lasting 4 beats with an avg rate of   100 bpm. Isolated SVEs were rare (<1.0%), SVE Couplets were rare (<1.0%), and no SVE Triplets were present. Isolated VEs were frequent (25.5%, [id number]), VE Couplets were occasional (1.5%, 5891), and VE Triplets were rare (<1.0%, 933). Ventricular Bigeminy and Trigeminy were Present. ?? One patient triggered events was associated with sinus rhythm with frequent VEs.   ?? 05/04/2020 ZioPatch: PVC burden 21%    Non-Invasive Evaluation(s):  Echo:  ?? 06/08/2003:  LVEF >55%, normal RV systolic function, normal echo  ?? 05/21/2017:  LVEF 50-55%, mild RV dilation, normal RV systolic function, mild RAE  ?? 01/03/2019:  LVEF 55-60%, normal RV size and systolic function  ?? 11/01/2019:  LVEF 55-60%, normal RV size and systolic function    Cardiac CT/MRI/Nuclear Tests:  ?? 11/24/2000 Nuclear/Resting Thallium MPI:  Intraseptal defect extending from the mid portion of the left   ventricle to the base with borderline washout. ??As this is the   only defect, it is not suggestive of sarcoid, however, one cannot   exclude sarcoidosis based on study.   ?? 01/10/2004 Nuclear/Thallium viability study:  1. Normal rest and redistribution Thallium study.   2. ?? ?? No evidence of cardiac sarcoid noted.   ?? 05/20/2017 PET myocardial metabolic scan:    - No evidence of myocardial sarcoid  - Rest imaging revealed homogeneuous radioisotope tracer uptake with no resting perfusion abnormalities. Thre was no evidence of F18-FDG uptake.   - At rest: The ejection fraction estimated at 60% with normal function.   - Also noted is FDG uptake in peri aortic lymph node  - Other CT findings as per prior dedicated CT  ?? 04/21/2019 Cardiac MRI:      - No evidence of myocardial sarcoid  - Rest imaging revealed homogeneuous radioisotope tracer uptake with no resting perfusion abnormalities. Thre was no evidence of F18-FDG uptake.   - At rest: The ejection fraction estimated at 60% with normal function.   - Also noted is FDG uptake in peri aortic lymph node  - Other CT findings as per prior dedicated CT   ??  07/19/2019 PET CT Myocardial Metabolic  *Abnormal PET sarcoid study. As compared to last PET dated 05/2017, there is evidence of active inflammation in the segments as below.   *There is moderate FDG uptake predominantly involving the following myocardial segments, suggestive of active inflammation/sarcoidosis: mid-anterior, mid-anteroseptal, mid inferoseptal, mid-anterolateral, basal inferior, mid inferolateral and basal inferolateral segments.  *Left ventricular systolic function is moderately reduced. Post stress the ejection fraction is calculated at 40% on rubidium study.  *Incidentally noted on the attenuation CT scan are bilateral upper lobe predominant fibrotic changes, similar to previous CTA chest dated 06/28/18.  ?? 10/19/2019:  Cardiac MRI  - Negative MRI for cardiac sarcoidosis: no evidence of fibrosis or scar on late gadolinium sequences  - Limited assessment of ventricular function due to frequent ectopy. LV function is probably low normal, LVEF 51%  - Right ventricle appears mildly dilated with mildly reduced systolic function  - PA size is upper normal  ?? 02/28/20 Cardiac PET:  no FDG uptake/inflammation (resolved)  - There is no myocardial FDG uptake indicating no significant myocardial inflammation / active cardiac sarcoidosis.  - There is normal resting perfusion.  - Compared to prior study dated 09/06/2019, there has been interval resolution of FDG uptake.  - Left ventricular ejection fraction is calculated on rubidium rest study at 48%.  - No significant coronary calcifications were noted on the attenuation CT  - There is grossly unchanged appearance of bilateral upper lobe predominant fibrotic changes.      6 Minute Walk:  ?? 11/01/19:  1574 feet.  Pre-walk HR 80 bpm, room air pulse oximetry 99%.  Post-walk HR 92 bpm, room air pulse oximetry 99%. Complained of some SOB      Cardiopulmonary Stress Tests:  ??  none    Immunosuppression history:   Prednisone 40 mg/d, 9/17-10/07/2019 (pre-MRI)   Prednisone 40 mg/d, 11/30/19-03/12/20 (original goal was x3 months then taper; pt self-discontinued Prednisone)    Prednisone 10 mg/d, 04/03/20-09/18/20   Prednisone 7.5 mg/d, 09/19/20 - present, with monthly taper by 2.5 mg/d (anticipated off 12/13/20)   MMF 500 mg BID, 09/19/20 - present        Excerpts from ZioPatch 03/2019:    ??      Other Past Medical History:  ?? Pulmonary history as per Dr. Marily Lente clinic notes reviewed.  Highlights:  -PA gram 02/2012: IMPRESSION:   1. Changes from chronic pulmonary emboli in the medial basilar branch of the left lower lobe are lateral basal branches of the pulmonary artery.  2. Normal appearing right pulmonary artery circulation.  3. Borderline elevated pressure in the left pulmonary artery with~normal right pulmonary and main pulmonary artery pressures.   -V/Q scan 05/20/11:  1. Perfusion defect again noted in the medial aspect of the left lower lobe, unchanged since the previous study dated 11/12/07. This could represent residual perfusion abnormality, such as from a pulmonary infarct, but recurrent PE in the same distribution can not be entirely excluded.  2. No new perfusion defects identified. The ventilation study is unremarkable, and is unchanged from the prior scan.   -PSG 10/2013: This study shows Sleep Apnea corrected with the use of CPAP at 9 cm H2O Mask: Quattro-Mirage full-face size medium . The patient should be prescribed heated humidifier for nasal dryness.    See below for the complete EPIC list of past medical and surgical history.    -His only past surgery has been related to perforated diverticulitis with temporary colostomy, s/p ventral hernia repair  Current medications:  Current Outpatient Medications on File Prior to Visit   Medication Sig   ??? acetaminophen (TYLENOL) 325 MG tablet Take 2 tablets (650 mg total) by mouth every six (6) hours as needed for pain.   ??? albuterol HFA 90 mcg/actuation inhaler Inhale 2 puffs every 4 to 6 hours as needed for shortness of breath and wheezing.   ??? blood sugar diagnostic (ON CALL EXPRESS TEST STRIP) Strp Check blood sugar 3 times daily.   ??? blood sugar diagnostic (ON CALL EXPRESS TEST STRIP) Strp Use to test blood glucose three times a day   ??? blood-glucose meter Misc Use as instructed   ??? cetirizine (ZYRTEC) 10 MG tablet Take 10 mg by mouth daily.   ??? diclofenac sodium (VOLTAREN) 1 % gel Apply 2 grams topically Four (4) times a day.   ??? empagliflozin (JARDIANCE) 10 mg tablet Take 1 tablet (10 mg total) by mouth every morning.   ??? fluticasone propionate (FLONASE) 50 mcg/actuation nasal spray Use 1 spray in each nostril daily as needed.   ??? lancets 30 gauge Misc Use as directed   ??? losartan (COZAAR) 50 MG tablet Take 2 tablets (100 mg total) by mouth in the morning.   ??? metFORMIN (GLUCOPHAGE) 1000 MG tablet Take 1 tablet (1,000 mg total) by mouth two (2) times a day.   ??? metoprolol succinate (TOPROL-XL) 200 MG 24 hr tablet Take 1 tablet (200 mg total) by mouth at bedtime.   ??? pantoprazole (PROTONIX) 40 MG tablet Take 1 tablet (40 mg total) by mouth daily.   ??? pen needle, diabetic 29 gauge x 1/2 (12 mm) Ndle Please use with Lantus Solostar pen   ??? rosuvastatin (CRESTOR) 10 MG tablet Take 2 tablets (20 mg total) by mouth daily.     No current facility-administered medications on file prior to visit.   -Notes:  Took Prednisone 12/10/19-03/12/20; stopped Bactrim, pantoprazole and Invokana around 03/12/2020    Allergies:  Patient has no known allergies.    Family History:  The patient's family history includes Diabetes in his brother and mother; Heart attack in his brother and father; Hypertension in his brother and mother; Sleep apnea in his brother and mother.   Father had heart attack (in his 32s), stroke (age 24), and prostate Ca (age 47).  Brother had heart attack (age 97).   No FH of HF or sarcoid.      Social History:  He lives with his mother and fiancee.    He is currently unemployed and on disability.  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 He spends time with both his mother and fiancee. His exercise is significantly limited by fear of precipitating his palpitations. Diet is average, he strives to avoid high-sugar foods and eats fast food 1-2x/week.   He  reports that he has quit smoking. He has a 3.75 pack-year smoking history. He has never used smokeless tobacco. He reports previous alcohol use. He reports previous drug use.  H/o previous use of marijuana and cocaine. Think he last used THC around 2019.  Prior tobacco history was 1/2 pack over a 1 week, quit in early 2021.  (Epic notes h/o 06/2003 inpatient [facility] +cocaine.)  In 2019, while working he would drink alcohol socially on the weekends.  Stopped drinking alcohol in late-11/2019 except occasional homemade wine when grapes and season.      Review of Systems:  As  per HPI.  Rest of the review of systems is negative or unremarkable except as stated above.    Physical Exam:  VITAL SIGNS:   Vitals:    09/18/20 1414   BP: 148/88   Pulse: 73   SpO2: 96%      Wt Readings from Last 3 Encounters:   09/18/20 (!) 115.7 kg (255 lb)   06/25/20 (!) 116.2 kg (256 lb 3.2 oz)   06/04/20 (!) 116.1 kg (256 lb)       Today's Body mass index is 34.58 kg/m??.   Height: 182.9 cm (6')  CONSTITUTIONAL: well-appearing middle-aged male in no acute distress  EYES: Conjunctivae and sclerae clear and anicteric.  ENT: Benign.   CARDIOVASCULAR: JVP not seen above the clavicle with HOB at 90 degrees. Rate and rhythm are regular with intermittent PVCs (~8 every 30sec).  PMI is normal.  There is no lifts or heaves.  Normal S1, S2. There is no murmur, gallops or rubs and no rub.  Radial pulses are 2+, bilaterally.   There is no lower extremity edema.   RESPIRATORY: Normal respiratory effort. Clear to auscultation bilaterally..  There are no wheezes.  GASTROINTESTINAL: Soft, non-tender, with audible bowel sounds. Abdomen nondistended.   SKIN: No rashes, ecchymosis or petechiae on clothed exam.  Warm, well perfused.   NEURO/PSYCH: Appropriate mood and affect. Alert and oriented to person, place, and time. No gross motor or sensory deficits evident.    Labs & Imaging:  Reviewed in EPIC.   Office Visit on 09/18/2020   Component Date Value Ref Range Status   ??? WBC 09/18/2020 7.6  3.6 - 11.2 10*9/L Final   ??? RBC 09/18/2020 5.83 (A) 4.26 - 5.60 10*12/L Final   ??? HGB 09/18/2020 16.0  12.9 - 16.5 g/dL Final   ??? HCT 16/10/9602 48.8 (A) 39.0 - 48.0 % Final   ??? MCV 09/18/2020 83.7  77.6 - 95.7 fL Final   ??? MCH 09/18/2020 27.4  25.9 - 32.4 pg Final   ??? MCHC 09/18/2020 32.7  32.0 - 36.0 g/dL Final   ??? RDW 54/09/8117 14.7  12.2 - 15.2 % Final   ??? MPV 09/18/2020 7.7  6.8 - 10.7 fL Final   ??? Platelet 09/18/2020 208  150 - 450 10*9/L Final   ??? BNP 09/18/2020 15.38  <=100 pg/mL Final     Lab Results   Component Value Date    BNP 15.38 09/18/2020    BNP 8.03 06/04/2020    BNP 7.70 01/12/2020    PRO-BNP 15.3 05/08/2017    PRO-BNP 22.4 08/20/2016     Other pertinent records were reviewed.    The following are further history from the patient's EPIC record for reference:     Past Medical History:   Diagnosis Date   ??? Arthritis    ??? Chronic pulmonary embolism (CMS-HCC)    ??? Diabetes mellitus (CMS-HCC)    ??? Diverticulitis    ??? HTN (hypertension)    ??? Lung disease     SARCOID   ??? Nephrolithiasis 12/12/2014   ??? OSA (obstructive sleep apnea)    ??? Pulmonary embolism and infarction (CMS-HCC) 02/18/2011   ??? Pure hypercholesterolemia 11/06/2019   ??? Sarcoidosis    ??? Seasonal allergic rhinitis    ??? Tobacco abuse 11/21/2014   ??? Ventricular tachycardia (CMS-HCC) 09/29/2019     Past Surgical History:   Procedure Laterality Date   ??? ABDOMINAL ADHESION SURGERY  08/20/2005    Colorectostomy    ??? PR  COLONOSCOPY W/BIOPSY SINGLE/MULTIPLE N/A 10/15/2015    Procedure: COLONOSCOPY, FLEXIBLE, PROXIMAL TO SPLENIC FLEXURE; WITH BIOPSY, SINGLE OR MULTIPLE;  Surgeon: Alfred Levins, MD;  Location: [facility];  Service: Gastroenterology   ??? PR REPAIR INCISIONAL HERNIA,REDUCIBLE N/A 11/28/2016    Procedure: ROBOTIC REPAIR INIT INCISIONAL OR VENTRAL HERNIA; REDUCIBLE;  Surgeon: Colon Branch, MD;  Location: [facility];  Service: General Surgery   ??? SKIN BIOPSY     ??? SUBTOTAL COLECTOMY  05/27/2005    s/p colostomy, now reversed    ??? VENTRAL HERNIA REPAIR  ~2013    with mesh placement

## 2020-09-18 NOTE — Unmapped (Signed)
Patient has been ordered a Zio    Patient given instructions and education regarding Zio.   Printed material provided to patient.   Verbalized understanding.   Interpretor used? No.

## 2020-09-20 MED ORDER — MYCOPHENOLATE MOFETIL 500 MG TABLET
ORAL_TABLET | Freq: Two times a day (BID) | ORAL | 3 refills | 90.00000 days | Status: CP
Start: 2020-09-20 — End: 2021-09-20
  Filled 2020-09-24: qty 180, 90d supply, fill #0

## 2020-09-21 NOTE — Unmapped (Signed)
Patient does not want to schedule procedure. He states he is seeing Derwood Kaplan in October. He will decide after that if he wants to move forward. Dr. Kathi Ludwig notified.

## 2020-09-21 NOTE — Unmapped (Signed)
[facility] Specialty Medication Onboarding    Specialty Medication: mycophenolate 500 mg tablet (CELLCEPT)  Prior Authorization: Not Required   Financial Assistance: No - copay  <$25  Final Copay/Day Supply: $4 / 90    Insurance Restrictions: None     Notes to Pharmacist: n/a    The triage team has completed the benefits investigation and has determined that the patient is able to fill this medication at [facility]. Please contact the patient to complete the onboarding or follow up with the prescribing physician as needed.

## 2020-09-21 NOTE — Unmapped (Signed)
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  I am speaking to the patient.    Was a Nurse, learning disability used for this call? No    Verified patient's date of birth / HIPAA.    Specialty medication(s) to be sent: General Specialty: mycophenolate 500mg       Non-specialty medications/supplies to be sent: Prednisone, Xarelto      Medications not needed at this time: n/a         Cellcept (mycopheonlate mofetil)    Medication & Administration     Dosage: Take 1 tablets (500mg ) by mouth two times daily    Administration:   ??? Take by mouth with or without food.   ??? Taking with food can minimize GI side effects.   ??? Swallow capsules whole, do not crush or chew.  ??? Oral suspension should be shaken well prior to administration.  Do not mix with other medications and discard any unused portion 60 days after constitution.      Adherence/Missed dose instructions:  Take a missed dose as soon as you remember it . If it is close to the time of your next dose, skip the missed dose and resume your normal schedule.Never take 2 doses to try and catch up from a missed dose.    Goals of Therapy     Reduce immune response    Side Effects & Monitoring Parameters     ??? Feeling tired or weak  ??? Shakiness  ??? Trouble sleeping  ??? Diarrhea, abdominal pain, nausea, vomiting, constipation or decreased appetite  ??? Decreases in blood counts   ??? Back or joint pain  ??? Hypertension or hypotension  ??? High blood sugar  ??? Headache  ??? Skin rash    The following side effects should be reported to the provider:  ??? Reduced immune function - report signs of infection such as fever; chills; body aches; very bad sore throat; ear or sinus pain; cough; more sputum or change in color of sputum; pain with passing urine; wound that will not heal, etc.  Also at a slightly higher risk of some malignancies (mainly skin and blood cancers) due to this reduced immune function.  ??? Allergic reaction (rash, hives, swelling, shortness of breath)  ??? High blood sugar (confusion, feeling sleepy, more thirst, more hungry, passing urine more often, flushing, fast breathing, or breath that smells like fruit)  ??? Electrolyte issues (mood changes, confusion, muscle pain or weakness, a heartbeat that does not feel normal, seizures, not hungry, or very bad upset stomach or throwing up)  ??? High or low blood pressure (bad headache or dizziness, passing out, or change in eyesight)  ??? Kidney issues (unable to pass urine, change in how much urine is passed, blood in the urine, or a big weight gain)  ??? Skin (oozing, heat, swelling, redness, or pain), UTI and other infections   ??? Chest pain or pressure  ??? Abnormal heartbeat  ??? Unexplained bleeding or bruising  ??? Abnormal burning, numbness, or tingling  ??? Muscle cramps,  ??? Yellowing of skin or eyes    Monitoring parameters  ??? Pregnancy   ??? CBC   ??? Renal and hepatic function    Contraindications, Warnings, & Precautions     ??? *This is a REMS drug and an FDA-approved patient medication guide will be printed with each dispensation  ??? Black  Box Warning: Infections   ??? Black Box Warning: Lymphoproliferative disorders - risk of development of lymphoma and skin malignancy is increased  ??? Black Box Warning: Use during pregnancy is associated with increased risks of first trimester pregnancy loss and congenital malformations.   ??? Black Box Warning: Females of reproductive potential should use contraception during treatment and for 6 weeks after therapy is discontinued  ??? CNS depression  ??? New or reactivated viral infections  ??? Neutropenia  ??? Male patients and/or their male partners should use effective contraception during treatment of the male patient and for at least 3 months after last dose.  ??? Breastfeeding is not recommended during therapy and for 6 weeks after last dose    Drug/Food Interactions     ??? Medication list reviewed in Epic. The patient was instructed to inform the care team before taking any new medications or supplements. No drug interactions identified.   ??? Separate doses of antacids and this medication  ??? Check with your doctor before getting any vaccinations    Storage, Handling Precautions, & Disposal     ??? Store at room temperature in a dry place  ??? This medication is considered hazardous. Wash hands after handling and store out of reach or others, including children and pets.        Current Medications (including OTC/herbals), Comorbidities and Allergies     Current Outpatient Medications   Medication Sig Dispense Refill   ??? acetaminophen (TYLENOL) 325 MG tablet Take 2 tablets (650 mg total) by mouth every six (6) hours as needed for pain.  0   ??? albuterol HFA 90 mcg/actuation inhaler Inhale 2 puffs every 4 to 6 hours as needed for shortness of breath and wheezing. 8.5 g 9   ??? blood sugar diagnostic (ON CALL EXPRESS TEST STRIP) Strp Check blood sugar 3 times daily. 100 each 11   ??? blood sugar diagnostic (ON CALL EXPRESS TEST STRIP) Strp Use to test blood glucose three times a day 100 strip 11   ??? blood-glucose meter Misc Use as instructed 1 each 0   ??? cetirizine (ZYRTEC) 10 MG tablet Take 10 mg by mouth daily.     ??? diclofenac sodium (VOLTAREN) 1 % gel Apply 2 grams topically Four (4) times a day. 100 g 0   ??? empagliflozin (JARDIANCE) 10 mg tablet Take 1 tablet (10 mg total) by mouth every morning. 30 tablet 3   ??? fluticasone propionate (FLONASE) 50 mcg/actuation nasal spray Use 1 spray in each nostril daily as needed. 16 g 0   ??? lancets 30 gauge Misc Use as directed 100 each 3   ??? losartan (COZAAR) 50 MG tablet Take 2 tablets (100 mg total) by mouth in the morning. 90 tablet 3   ??? metFORMIN (GLUCOPHAGE) 1000 MG tablet Take 1 tablet (1,000 mg total) by mouth two (2) times a day. 180 tablet 3   ??? metoprolol succinate (TOPROL-XL) 200 MG 24 hr tablet Take 1 tablet (200 mg total) by mouth at bedtime. 90 tablet 3   ??? mycophenolate (CELLCEPT) 500 mg tablet Take 1 tablet (500 mg total) by mouth Two (2) times a day. 180 tablet 3   ??? pantoprazole (PROTONIX) 40 MG tablet Take 1 tablet (40 mg total) by mouth daily. 90 tablet 3   ??? pen needle, diabetic 29 gauge x 1/2 (12 mm) Ndle Please use with Lantus Solostar pen 100 each 11   ??? predniSONE (DELTASONE) 2.5 MG tablet Take 3 tablets (7.5 mg total)  by mouth daily for 30 days, THEN 2 tablets (5 mg total) daily for 30 days, THEN 1 tablet (2.5 mg total) daily. 180 tablet 0   ??? rivaroxaban (XARELTO) 20 mg tablet Take 1 tablet (20 mg total) by mouth daily with evening meal. 90 tablet 3   ??? rosuvastatin (CRESTOR) 10 MG tablet Take 2 tablets (20 mg total) by mouth daily. 90 tablet 3     No current facility-administered medications for this visit.       No Known Allergies    Patient Active Problem List   Diagnosis   ??? Pulmonary embolism and infarction (CMS-HCC)   ??? Sarcoid   ??? OSA (obstructive sleep apnea)   ??? Tobacco abuse   ??? Nephrolithiasis   ??? Ventral incisional hernia   ??? Bilateral carpal tunnel syndrome   ??? Ventricular tachycardia (CMS-HCC)   ??? Type 2 diabetes mellitus (CMS-HCC)   ??? Hypertension, essential   ??? Pure hypercholesterolemia   ??? Dizziness   ??? Palpitations   ??? Bigeminy   ??? PVC (premature ventricular contraction)       Reviewed and up to date in Epic.    Appropriateness of Therapy     Acute infections noted within Epic:  No active infections  Patient reported infection: None    Is medication and dose appropriate based on diagnosis and infection status? Yes    Prescription has been clinically reviewed: Yes      Baseline Quality of Life Assessment      How many days over the past month did your cardiac sarcoidosis  keep you from your normal activities? For example, brushing your teeth or getting up in the morning. Patient declined to answer    Financial Information     Medication Assistance provided: None Required    Anticipated copay of $4 (90 days) reviewed with patient. Verified delivery address.    Delivery Information     Scheduled delivery date: 09/25/20    Expected start date: 09/25/20    Medication will be delivered via Next Day Courier to the prescription address in [facility].  This shipment will not require a signature.      Explained the services we provide at [facility] and that each month we would call to set up refills.  Stressed importance of returning phone calls so that we could ensure they receive their medications in time each month.  Informed patient that we should be setting up refills 7-10 days prior to when they will run out of medication.  A pharmacist will reach out to perform a clinical assessment periodically.  Informed patient that a welcome packet, containing information about our pharmacy and other support services, a Notice of Privacy Practices, and a drug information handout will be sent.      The patient or caregiver noted above participated in the development of this care plan and knows that they can request review of or adjustments to the care plan at any time.      Patient or caregiver verbalized understanding of the above information as well as how to contact the pharmacy at [phone] option 4 with any questions/concerns.  The pharmacy is open Monday through Friday 8:30am-4:30pm.  A pharmacist is available 24/7 via pager to answer any clinical questions they may have.    Patient Specific Needs     - Does the patient have any physical, cognitive, or cultural barriers? No    - Does the patient have adequate living arrangements? (i.e.  the ability to store and take their medication appropriately) Yes    - Did you identify any home environmental safety or security hazards? No    - Patient prefers to have medications discussed with  Patient     - Is the patient or caregiver able to read and understand education materials at a high school level or above? Yes    - Patient's primary language is  English     - Is the patient high risk? Yes, patient is taking a REMS drug. Medication is dispensed in compliance with REMS program    - Does the patient require physician intervention or other additional services (i.e. dietary/nutrition, smoking cessation, social work)? No      Camillo Flaming  [facility] Specialty Pharmacist

## 2020-10-01 DIAGNOSIS — D8685 Sarcoid myocarditis: Principal | ICD-10-CM

## 2020-10-05 MED FILL — JARDIANCE 10 MG TABLET: ORAL | 30 days supply | Qty: 30 | Fill #3

## 2020-10-10 MED FILL — PANTOPRAZOLE 40 MG TABLET,DELAYED RELEASE: ORAL | 90 days supply | Qty: 90 | Fill #1

## 2020-10-12 NOTE — Unmapped (Signed)
Called Robert Howell to remind him to get labs. Confirmed he will start decreasing prednisone tomorrow. I will follow up next week on lab results.

## 2020-10-14 ENCOUNTER — Emergency Department
Admit: 2020-10-14 | Discharge: 2020-10-14 | Disposition: A | Discharge status: Home / Self Care | Payer: PRIVATE HEALTH INSURANCE | Attending: Student in an Organized Health Care Education/Training Program

## 2020-10-14 ENCOUNTER — Ambulatory Visit
Admit: 2020-10-14 | Discharge: 2020-10-14 | Disposition: A | Discharge status: Home / Self Care | Payer: PRIVATE HEALTH INSURANCE | Attending: Student in an Organized Health Care Education/Training Program

## 2020-10-14 DIAGNOSIS — U071 COVID: Principal | ICD-10-CM

## 2020-10-14 LAB — CBC W/ AUTO DIFF
BASOPHILS ABSOLUTE COUNT: 0 10*9/L (ref 0.0–0.1)
BASOPHILS RELATIVE PERCENT: 0.8 %
EOSINOPHILS ABSOLUTE COUNT: 0.1 10*9/L (ref 0.0–0.5)
EOSINOPHILS RELATIVE PERCENT: 2.6 %
HEMATOCRIT: 46.7 % (ref 39.0–48.0)
HEMOGLOBIN: 15.6 g/dL (ref 12.9–16.5)
LYMPHOCYTES ABSOLUTE COUNT: 1.7 10*9/L (ref 1.1–3.6)
LYMPHOCYTES RELATIVE PERCENT: 30.6 %
MEAN CORPUSCULAR HEMOGLOBIN CONC: 33.3 g/dL (ref 32.0–36.0)
MEAN CORPUSCULAR HEMOGLOBIN: 27.3 pg (ref 25.9–32.4)
MEAN CORPUSCULAR VOLUME: 82 fL (ref 77.6–95.7)
MEAN PLATELET VOLUME: 8.1 fL (ref 6.8–10.7)
MONOCYTES ABSOLUTE COUNT: 0.6 10*9/L (ref 0.3–0.8)
MONOCYTES RELATIVE PERCENT: 11.4 %
NEUTROPHILS ABSOLUTE COUNT: 3.1 10*9/L (ref 1.8–7.8)
NEUTROPHILS RELATIVE PERCENT: 54.6 %
NUCLEATED RED BLOOD CELLS: 0 /100{WBCs} (ref <=4)
PLATELET COUNT: 178 10*9/L (ref 150–450)
RED BLOOD CELL COUNT: 5.7 10*12/L — ABNORMAL HIGH (ref 4.26–5.60)
RED CELL DISTRIBUTION WIDTH: 14.5 % (ref 12.2–15.2)
WBC ADJUSTED: 5.7 10*9/L (ref 3.6–11.2)

## 2020-10-14 LAB — COMPREHENSIVE METABOLIC PANEL
ALBUMIN: 3.8 g/dL (ref 3.4–5.0)
ALKALINE PHOSPHATASE: 49 U/L (ref 46–116)
ALT (SGPT): 21 U/L (ref 10–49)
ANION GAP: 8 mmol/L (ref 5–14)
AST (SGOT): 23 U/L (ref <=34)
BILIRUBIN TOTAL: 0.5 mg/dL (ref 0.3–1.2)
BLOOD UREA NITROGEN: 11 mg/dL (ref 9–23)
BUN / CREAT RATIO: 10
CALCIUM: 8.9 mg/dL (ref 8.7–10.4)
CHLORIDE: 107 mmol/L (ref 98–107)
CO2: 23.2 mmol/L (ref 20.0–31.0)
CREATININE: 1.06 mg/dL
EGFR CKD-EPI (2021) MALE: 83 mL/min/{1.73_m2} (ref >=60)
GLUCOSE RANDOM: 115 mg/dL (ref 70–179)
POTASSIUM: 4.5 mmol/L (ref 3.4–4.8)
PROTEIN TOTAL: 7.2 g/dL (ref 5.7–8.2)
SODIUM: 138 mmol/L (ref 135–145)

## 2020-10-14 LAB — HIGH SENSITIVITY TROPONIN I - SINGLE: HIGH SENSITIVITY TROPONIN I: 3 ng/L (ref <=53)

## 2020-10-14 NOTE — Unmapped (Signed)
.  Patient rounding complete, call bell in reach, bed locked and in lowest position, patient belongings at bedside and within reach of patient.  Patient updated on plan of care.      Pt awaiting to see the doctor.

## 2020-10-14 NOTE — Unmapped (Signed)
Recent:   What is the date of your last related visit?  None  Related acute medications Rx'd:  na  Home treatment tried:  Mucinex      Relevant:   Allergies: Patient has no known allergies.  Medications: Prednisone, Albuterol, Metformin, Jardiance, Xarelto  Health History: Asthma, DM, HTN, DVT, Afib  Weight: 255lbs 6'    S: Chest pain 2 or 3/10 (normal/chronic), COVID positive 10/01, mild headache/sore throat/cough, no SOB, no fever  B: DM, Asthma, HTN, AFIB, DVT  A: Go to ED  R: Page per practice      Reason for Disposition  ??? Chest pain or pressure    Answer Assessment - Initial Assessment Questions  1. COVID-19 DIAGNOSIS: Who made your COVID-19 diagnosis? Was it confirmed by a positive lab test or self-test? If not diagnosed by a doctor (or NP/PA), ask Are there lots of cases (community spread) where you live? Note: See public health department website, if unsure.      Home test 10/01  2. COVID-19 EXPOSURE: Was there any known exposure to COVID before the symptoms began? CDC Definition of close contact: within 6 feet (2 meters) for a total of 15 minutes or more over a 24-hour period.       no  3. ONSET: When did the COVID-19 symptoms start?       09/29  4. WORST SYMPTOM: What is your worst symptom? (e.g., cough, fever, shortness of breath, muscle aches)      Headache - mild  5. COUGH: Do you have a cough? If Yes, ask: How bad is the cough?        Yes, mild  6. FEVER: Do you have a fever? If Yes, ask: What is your temperature, how was it measured, and when did it start?      Denies  7. RESPIRATORY STATUS: Describe your breathing? (e.g., shortness of breath, wheezing, unable to speak)       Breathing is normal, no wheezing, no SOB, speaking in full sentences  8. BETTER-SAME-WORSE: Are you getting better, staying the same or getting worse compared to yesterday?  If getting worse, ask, In what way?      Same  9. HIGH RISK DISEASE: Do you have any chronic medical problems? (e.g., asthma, heart or lung disease, weak immune system, obesity, etc.)      Asthma, DM  10. VACCINE: Have you had the COVID-19 vaccine? If Yes, ask: Which one, how many shots, when did you get it?        Yes  11. BOOSTER: Have you received your COVID-19 booster? If Yes, ask: Which one and when did you get it?        Yes  12. PREGNANCY: Is there any chance you are pregnant? When was your last menstrual period?        na  13. OTHER SYMPTOMS: Do you have any other symptoms?  (e.g., chills, fatigue, headache, loss of smell or taste, muscle pain, sore throat)        Sore throat, headache, chest pain - 2 or 3/10 (chronic)  14. O2 SATURATION MONITOR:  Do you use an oxygen saturation monitor (pulse oximeter) at home? If Yes, ask What is your reading (oxygen level) today? What is your usual oxygen saturation reading? (e.g., 95%)        Na    BP146/90 HR 78    Protocols used: CORONAVIRUS (COVID-19) DIAGNOSED OR SUSPECTED-A-AH

## 2020-10-14 NOTE — Unmapped (Signed)
Pt stating sore throat, coughing, and HA since Thursday. Pt stating +COVID test at home. Pt stating someone told him to come in to be evaluated for sx.

## 2020-10-14 NOTE — Unmapped (Signed)
.  Patient rounding complete, call bell in reach, bed locked and in lowest position, patient belongings at bedside and within reach of patient.  Patient updated on plan of care.      Pt laying on the bed using his cell phone.

## 2020-10-14 NOTE — Unmapped (Signed)
Copied from CRM [id number]. Topic: HL - Nurse Triage - [facility] Contract Page  >> Oct 14, 2020  5:27 AM Venetia Night, RN wrote:  Please use the following dot phrase: .[email or web address]  [id number] [facility] re: Robert Howell [phone]     Source should be ON CALL PROVIDER   Subject should indicate PATIENT   Attach the Triage Encounter by clicking Attachments and under Patient Actions Clinical Call -click copy and copy all notes and Continue   Enter the name & time of page into the Provider Paging form each time the on call provider is paged  Route/Resolve ONLY when there is a return page or the call is completed using appropriate reason and close workspace    [id number] [facility] re: Robert Howell [phone]     [phone]  Paged Dr. Forrestine Him  [phone]  Re-paged Dr. Yetta Barre provider listed in patient chart is a resident provider.      [phone]  Paged resident on call.    [phone]  Re-paged resident on call.    Made several attempts to contact patient and send to the ED.  No answer.  Mailbox full.

## 2020-10-14 NOTE — Unmapped (Signed)
Regarding: Covid+; sore throat, headache should he go to ED  ----- Message from Pearlean Brownie sent at 10/13/2020  9:07 PM EDT -----  If you had not called [facility], what do you think you would have done?   Go to Emergency Dept

## 2020-10-14 NOTE — Unmapped (Signed)
 [facility]  Emergency Department Provider Note      ED Clinical Impression     Final diagnoses:   COVID (Primary)       Initial Impression, ED Course, Assessment and Plan     Impression: Robert Howell is a 55 y.o. 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     ED Triage Vitals [10/14/20 0705]   Enc Vitals Group      BP 175/98      Heart Rate 86      SpO2 Pulse       Resp 20      Temp 37.6 ??C (99.7 ??F)      Temp Source Oral      SpO2 97 %      Weight (!) 113.4 kg (250 lb)      Height 1.829 m (6')     On exam, the patient is alert, oriented, and nontoxic. Vitals are significant for hypertension to 175/98. Exam is notable for mild erythema of posterior oropharynx. Normal rate and regular rhythm. Distal pulses intact.  Patient appears to be breathing comfortably and LCTAB. Abdomen is soft and nontender. BLE are without edema, discoloration, or tenderness.     Plan for EKG, CXR, basic labs, hsTrop, and 4plex.    EKG shows normal sinus rhythm, every other beat has right bundle branch block morphology, axis 93 degrees, normal PR interval, QTC 450 ms, no significant ST elevation or depression. No significant change from prior.    COVID positive.  No leukocytosis, no anemia.  Normal electrolytes.  Normal creatinine.  Normal LFTs.  High-sensitivity troponin 3.  Chest x-ray unchanged from prior, no new opacifications.    Patient is not a candidate for Paxlovid given medication interactions.  Spoke with pharmacist and ordered monoclonal antibody infusion however when consenting patient for this he politely declined.  He overall is quite well-appearing and I believe is safe to return home at this time.  I have encouraged close follow-up with his primary care provider, cardiologist, and pulmonologist.  Return precautions provided.  Patient discharged in good condition.  He expressed understanding agreement with today's evaluation and plan.    Additional Medical Decision Making     I independently visualized the EKG tracing.   I independently visualized the radiology images.   I reviewed the patient's prior medical records.     Labs and radiology results that were available during my care of the patient were independently reviewed by me and considered in my medical decision making.    Portions of this record have been created using Scientist, clinical (histocompatibility and immunogenetics). Dictation errors have been sought, but may not have been identified and corrected.  ____________________________________________    I have reviewed the triage vital signs and the nursing notes.     History     Chief Complaint  Flu Like Symptoms      HPI   Robert Howell is a 55 y.o. 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 Patient reports 1 day of chest tightness and discomfort in the setting testing positive for COVID-19 with an at-home test yesterday after experiencing 3 days of sore throat, cough, and headache. He endorses chronic shortness of breath, which is slightly worse than at baseline currently. His fiancee began to experience similar URI symptoms yesterday, which prompted them to take an at-home COVID-19 test of which they both tested positive for. He denies fever, diaphoresis, body aches, changes in PO intake, emesis, or diarrhea.     Per chart review, patient presented to cardiology clinic on 09/18/20 for a follow-up appointment. During this appointment, he stated he elected against proceeding with PVC ablation 2/2 concerns with procedure and recovery time. He endorsed continuing to experience daily palpitations with associated lightheadedness and exertional intolerance that remained unchanged from previous appointments. His cardiologist recommended to taper off Prednisone, begin Mycophenolate BID, and repeat ZioPatch with goal to repeat PET when off Prednisone and potentially repeat ZioPatch depending on whether he decides to proceed with PVC ablation in the future. However, the patient notes that he has not started to taper his Prednisone nor begin Mycophenolate.     Past Medical History:   Diagnosis Date   ??? Arthritis    ??? Chronic pulmonary embolism (CMS-HCC)    ??? Diabetes mellitus (CMS-HCC)    ??? Diverticulitis    ??? HTN (hypertension)    ??? Lung disease     SARCOID   ??? Nephrolithiasis 12/12/2014   ??? OSA (obstructive sleep apnea)    ??? Pulmonary embolism and infarction (CMS-HCC) 02/18/2011   ??? Pure hypercholesterolemia 11/06/2019   ??? Sarcoidosis    ??? Seasonal allergic rhinitis    ??? Tobacco abuse 11/21/2014   ??? Ventricular tachycardia 09/29/2019       Patient Active Problem List   Diagnosis   ??? Pulmonary embolism and infarction (CMS-HCC)   ??? Sarcoid   ??? OSA (obstructive sleep apnea)   ??? Tobacco abuse   ??? Nephrolithiasis   ??? Ventral incisional hernia   ??? Bilateral carpal tunnel syndrome   ??? Ventricular tachycardia   ??? Type 2 diabetes mellitus (CMS-HCC)   ??? Hypertension, essential   ??? Pure hypercholesterolemia   ??? Dizziness   ??? Palpitations   ??? Bigeminy   ??? PVC (premature ventricular contraction)       Past Surgical History:   Procedure Laterality Date   ??? ABDOMINAL ADHESION SURGERY  08/20/2005    Colorectostomy    ??? PR COLONOSCOPY W/BIOPSY SINGLE/MULTIPLE N/A 10/15/2015    Procedure: COLONOSCOPY, FLEXIBLE, PROXIMAL TO SPLENIC FLEXURE; WITH BIOPSY, SINGLE OR MULTIPLE;  Surgeon: Alfred Levins, MD;  [facility];  Service: Gastroenterology   ??? PR REPAIR INCISIONAL HERNIA,REDUCIBLE N/A 11/28/2016    Procedure: ROBOTIC REPAIR INIT INCISIONAL OR VENTRAL HERNIA; REDUCIBLE;  Surgeon: Colon Branch, MD;  Location: [facility];  Service: General Surgery   ??? SKIN BIOPSY     ??? SUBTOTAL COLECTOMY  05/27/2005    s/p colostomy, now reversed    ??? VENTRAL HERNIA REPAIR  ~2013    with mesh placement       No current facility-administered medications for this encounter.    Current Outpatient Medications:   ???  acetaminophen (TYLENOL) 325 MG tablet, Take 2 tablets (650 mg total) by mouth every six (6) hours as needed for pain., Disp: , Rfl: 0  ???  albuterol HFA 90 mcg/actuation inhaler, Inhale 2 puffs every 4 to 6 hours as needed  for shortness of breath and wheezing., Disp: 8.5 g, Rfl: 9  ???  blood sugar diagnostic (ON CALL EXPRESS TEST STRIP) Strp, Check blood sugar 3 times daily., Disp: 100 each, Rfl: 11  ???  blood sugar diagnostic (ON CALL EXPRESS TEST STRIP) Strp, Use to test blood glucose three times a day, Disp: 100 strip, Rfl: 11  ???  blood-glucose meter Misc, Use as instructed, Disp: 1 each, Rfl: 0  ???  cetirizine (ZYRTEC) 10 MG tablet, Take 10 mg by mouth daily., Disp: , Rfl:   ???  diclofenac sodium (VOLTAREN) 1 % gel, Apply 2 grams topically Four (4) times a day., Disp: 100 g, Rfl: 0  ???  empagliflozin (JARDIANCE) 10 mg tablet, Take 1 tablet (10 mg total) by mouth every morning., Disp: 30 tablet, Rfl: 3  ???  fluticasone propionate (FLONASE) 50 mcg/actuation nasal spray, Use 1 spray in each nostril daily as needed., Disp: 16 g, Rfl: 0  ???  lancets 30 gauge Misc, Use as directed, Disp: 100 each, Rfl: 3  ???  losartan (COZAAR) 50 MG tablet, Take 2 tablets (100 mg total) by mouth in the morning., Disp: 90 tablet, Rfl: 3  ???  metFORMIN (GLUCOPHAGE) 1000 MG tablet, Take 1 tablet (1,000 mg total) by mouth two (2) times a day., Disp: 180 tablet, Rfl: 3  ???  metoprolol succinate (TOPROL-XL) 200 MG 24 hr tablet, Take 1 tablet (200 mg total) by mouth at bedtime., Disp: 90 tablet, Rfl: 3  ???  mycophenolate (CELLCEPT) 500 mg tablet, Take 1 tablet (500 mg total) by mouth Two (2) times a day., Disp: 180 tablet, Rfl: 3  ???  pantoprazole (PROTONIX) 40 MG tablet, Take 1 tablet (40 mg total) by mouth daily., Disp: 90 tablet, Rfl: 3  ???  pen needle, diabetic 29 gauge x 1/2 (12 mm) Ndle, Please use with Lantus Solostar pen, Disp: 100 each, Rfl: 11  ???  predniSONE (DELTASONE) 2.5 MG tablet, Take 3 tablets (7.5 mg total) by mouth daily for 30 days, THEN 2 tablets (5 mg total) daily for 30 days, THEN 1 tablet (2.5 mg total) daily., Disp: 180 tablet, Rfl: 0  ???  rivaroxaban (XARELTO) 20 mg tablet, Take 1 tablet (20 mg total) by mouth daily with evening meal., Disp: 90 tablet, Rfl: 3  ???  rosuvastatin (CRESTOR) 10 MG tablet, Take 2 tablets (20 mg total) by mouth daily., Disp: 90 tablet, Rfl: 3    Allergies  Patient has no known allergies.    Family History   Problem Relation Age of Onset   ??? Sleep apnea Mother    ??? Diabetes Mother    ??? Hypertension Mother    ??? Sleep apnea Brother    ??? Diabetes Brother    ??? Hypertension Brother    ??? Heart attack Brother    ??? Heart attack Father    ??? Anesthesia problems Neg Hx    ??? Bleeding Disorder Neg Hx    ??? Melanoma Neg Hx    ??? Basal cell carcinoma Neg Hx    ??? Squamous cell carcinoma Neg Hx        Social History  Social History     Tobacco Use   ??? Smoking status: Former Smoker     Packs/day: 0.25     Years: 15.00     Pack years: 3.75   ??? Smokeless tobacco: Never Used   ??? Tobacco comment: pt states quit around [employer] Use   ??? Vaping Use: Never used  Substance Use Topics   ??? Alcohol use: Yes     Comment: occasionally   ??? Drug use: Not Currently       Review of Systems  Constitutional: Positive for lightheadedness. Negative for fever.  ENT: Positive for sore throat.  Cardiovascular: Positive for palpitations and chest discomfort.   Respiratory: Positive for shortness of breath and cough.  Neurological: Positive for headaches. Negative for focal weakness or numbness.  A 10 point review of systems was performed and negative except as noted in the history of present illness.    Physical Exam     Constitutional: Alert and oriented. Well appearing and in no distress.  Eyes: Conjunctivae are normal.  ENT       Head: Normocephalic and atraumatic.       Nose: No congestion.       Mouth/Throat: Mild erythema of posterior oropharynx. Mucous membranes are moist.       Neck: No stridor.  Cardiovascular: Normal rate, regular rhythm. Distal pulses intact.  Respiratory: LCTAB. Normal respiratory effort. Breath sounds are normal.  Gastrointestinal: Soft and nontender. There is no CVA tenderness.  Musculoskeletal: BLE are without edema, discoloration, or tenderness. Normal range of motion in all extremities   Neurologic: Normal speech and language. No gross focal neurologic deficits are appreciate; normal strength and sensatoin  Skin: Skin is warm, dry and intact. No rash noted.  Psychiatric: Mood and affect are normal. Speech and behavior are normal.    EKG     EKG shows normal sinus rhythm, every other beat has right bundle branch block morphology, axis 93 degrees, normal PR interval, QTC 450 ms, no significant ST elevation or depression. No significant change from prior.    Radiology     EXAM: XR CHEST 2 VIEWS  DATE: 10/14/2020 10:22 AM  ACCESSION: [id number]  DICTATED: 10/14/2020 10:50 AM  INTERPRETATION LOCATION: [facility]  ??  CLINICAL INDICATION: 55 years old Male with SHORTNESS OF BREATH    ??  COMPARISON: Chest radiograph dated 12/14/2019, and other earlier studies.  ??  TECHNIQUE: PA and Lateral Chest Radiographs.  ??  FINDINGS:   Lungs are mildly hypoinflated, which accentuates the cardiac silhouette and central pulmonary vasculature.  ??  Mild diffuse prominence of the interstitial lung markings with associated peribronchial cuffing, findings are overall similar and in keeping with chronic changes seen on prior studies. Unchanged streaky left basilar opacities, may be chronic atelectasis and/or scarring. No new focal consolidation identified. No pleural effusion or pneumothorax.  ??  The cardiac silhouette is stable.  ??  Visualized upper abdomen is unremarkable.  ??  IMPRESSION:  Coarse interstitial lung markings and streaky left basilar opacities, overall unchanged when compared to prior exams and in keeping with previously-described scarring/fibrosis. No new focal consolidation is identified.EXAM: XR CHEST 2 VIEWS  DATE: 10/14/2020 10:22 AM  ACCESSION: [id number]  DICTATED: 10/14/2020 10:50 AM  INTERPRETATION LOCATION: [facility]  ??  CLINICAL INDICATION: 55 years old Male with SHORTNESS OF BREATH    ??  COMPARISON: Chest radiograph dated 12/14/2019, and other earlier studies.  ??  TECHNIQUE: PA and Lateral Chest Radiographs.  ??  FINDINGS:   Lungs are mildly hypoinflated, which accentuates the cardiac silhouette and central pulmonary vasculature.  ??  Mild diffuse prominence of the interstitial lung markings with associated peribronchial cuffing, findings are overall similar and in keeping with chronic changes seen on prior studies. Unchanged streaky left basilar opacities, may be chronic atelectasis and/or scarring. No new focal consolidation identified. No  pleural effusion or pneumothorax.  ??  The cardiac silhouette is stable.  ??  Visualized upper abdomen is unremarkable.  ??  IMPRESSION:  Coarse interstitial lung markings and streaky left basilar opacities, overall unchanged when compared to prior exams and in keeping with previously-described scarring/fibrosis. No new focal consolidation is identified.    Procedures     Procedure(s) performed: None.    Documentation assistance was provided by Cherly Hensen, Scribe, on October 14, 2020 at 7:17 AM for Ree Edman, MD.    Documentation assistance was provided by the scribe in my presence.  The documentation recorded by the scribe has been reviewed by me and accurately reflects the services I personally performed.       Luther Redo, MD  10/17/20 [phone]

## 2020-10-15 NOTE — Unmapped (Signed)
Reached out to discuss positive COVID test on 10/2 and offer potential therapies given history of pulmonary sarcoidosis, however, [name] shared that symptoms started Thursday, 9/29, and are already improved significantly. As therapies would not be received until 5 days after symptom onset, will defer therapies at this time.    Robert Govern MD, PGY-2  [facility] Internal Medicine Residency

## 2020-10-18 DIAGNOSIS — G4733 Obstructive sleep apnea (adult) (pediatric): Principal | ICD-10-CM

## 2020-10-18 NOTE — Unmapped (Signed)
[facility]     Type of visit:  video    Reason for visit: F/U    Questions / Concerns that need to be addressed:     General Consent to Treat (GCT) for non-epic video visits only: MyChart consent    Diabetes:  ??? Regularly checking blood sugars?: no  o If yes, when? Complete log for past 7 days  Date Before Breakfast After Breakfast Before Lunch After Lunch Before Dinner After Dinner Before Bed    10/18/20  120                                                                                                                                 Screening BP    Omron BPs (complete if screening BP has a systolic  > 130 or diastolic > 80)  BP#1    BP#2   BP#3     Average BP   (please note this as a comment in vitals)     Allergies reviewed: Yes    Medication reviewed: Yes  Pended refills? No    HCDM reviewed and updated in Epic:    We are working to make sure all of our patients??? wishes are updated in Epic and part of that is documenting a Environmental health practitioner for each patient  A Health Care Decision Maker is someone you choose who can make health care decisions for you if you are not able - who would you most want to do this for you????  was updated.    HCDM (patient stated preference): Robert Howell, Robert Howell - Mother - [phone]      BPAs completed:  HARK - Interpersonal Violence   PHQ-2  PHQ-9      COVID-19 Vaccine Summary  Which COVID-19 Vaccine was administered  Moderna  Type:  Dates Given:  06/25/2020     Date last COVID Positive Test:   10/14/2020      Date last mAB infusion:  10/14/2020              Immunization History   Administered Date(s) Administered   ??? COVID-19 VACCINE,MRNA(MODERNA)(PF)(IM) 12/08/2019, 01/03/2020, 06/25/2020   ??? Influenza Vaccine Quad (IIV4 PF) 36mo+ injectable 12/23/2011, 11/21/2014, 10/14/2016, 10/20/2019   ??? Pneumococcal Conjugate 13-Valent 05/08/2017   ??? TdaP 01/16/2020 __________________________________________________________________________________________    SCREENINGS COMPLETED IN FLOWSHEETS    HARK Screening  HARK Screening  Within the last year, have you been humiliated or emotionally abused in other ways by your partner or ex-partner?: No  Within the last year, have you been afraid of your partner or ex-partner?: No  Within the last year, have you been raped or forced to have any kind of sexual activity by your partner or ex-partner?: No  Within the last year, have you been kicked, hit, slapped, or otherwise physically hurt by your partner or ex-partner?: No    AUDIT  PHQ2       PHQ9          P4 Suicidality Screener                GAD7       COPD Assessment       Falls Risk

## 2020-10-18 NOTE — Unmapped (Addendum)
Please call [phone]  to schedule sleep study.     In order to get a new mask for your CPAP machine, please contact the [facility] Equipment Customer Service Team at [phone] for a mask refit.

## 2020-10-18 NOTE — Unmapped (Signed)
[facility] Internal Medicine   CARE MANAGEMENT ENCOUNTER           Date of Service:  10/18/2020      Service:  Care Coordination - General  MyChart use by patient is active: yes    Purpose of contact:     [employer] Fellow (MS) received request for CPAP repair for patient from [facility])    [employer] Fellow (MS) completed the following related to the above request:  ??? Reviewed chart    MS called [facility] (P: [phone])  ?? Representative says [facility] needs an updated note from the doctor (missing documentation for why patient needed replacement). Lucretia Kern reached out to [facility] Internal Medicine and talked to front desk who told him they would route the message to the pcp.  ?? PCP needs to addend 6/13 note. Insurance will not accept patient not using CPAP. The note needs to include that the patient is using the CPAP and is benefiting from a CPAP.     Sent information to Orville Govern, MD.     Provider/Care Partner(s)  to follow up on:   DME Request: CPAP    A copy of this Patient Outreach Encounter was sent to patient's Primary Care Provider

## 2020-10-18 NOTE — Unmapped (Signed)
Internal Medicine Video Visit    This visit is conducted via video conferencing.    Contact Information  Person Contacted: Patient  Contact Phone number: [phone] (home)   Is there someone else in the room? No.   Patient agreed to a video visit    Robert Howell is a 55 y.o. male  participating in a video visit.    Reason for visit:  F/u CPAP use    Subjective:  55 y.o. male being seen with a history of sarcoidosis with pulmonary and cutaneous involvement, h/o PVCs, OSA, DVT, and chronic PE on rivaroxaban.     He tested positively for COVID on 10/2, but symptoms originally began on 9/29.  He is feeling better from a COVID perspective.  No fevers or shortness of breath.  Originally diagnosed with OSA around 2015 and was using CPAP, however has not been using cpap for 2 years due to issues with his mask.  His PCP Dr. Veryl Speak has attempted to reschedule sleep study and also reorder CPAP mask, however patient has yet to do either of these things.  He said he tried to call a phone number at 1 point and was on hold for a long time and did not call back.    Without his CPAP, he says he sometimes wakes up not feeling rested or feeling like he hasn't slept very well but this is not necessarily every day.       I have reviewed the problem list, medications, and allergies and have updated/reconciled them if needed.    Objective:  Exam limited 2/2 video encounter   Gen: well appearing , in NAD , speech clear, answering questions in complete sentences. AOx4   HEENT: face symmetric     Assessment & Plan:  1. OSA (obstructive sleep apnea)    Sleep study in 2015 revealing moderate apnea with AHI 15.4, corrected with CPAP at 9 cm of water.  He has not been using CPAP for 2 years due to issues with the mask.  PCP has been attempting to reorder equipment and do new sleep study, however patient has been unable to do either of these things. Today's visit was intended to be follow up on equipment tolerance but unable to complete .   - message sent to case management to facilitate setting up home equipment and new sleep study given patient difficulties     Return in about 3 months (around 01/18/2021).       Staffed with: Dr. Madelon Lips        The patient reports they are currently: at home. I spent 15 minutes on the real-time audio and video with the patient on the date of service. I spent an additional 5 minutes on pre- and post-visit activities on the date of service.     The patient was physically located in [place] or a state in which I am permitted to provide care. The patient and/or parent/guardian understood that s/he may incur co-pays and cost sharing, and agreed to the telemedicine visit. The visit was reasonable and appropriate under the circumstances given the patient's presentation at the time.    The patient and/or parent/guardian has been advised of the potential risks and limitations of this mode of treatment (including, but not limited to, the absence of in-person examination) and has agreed to be treated using telemedicine. The patient's/patient's family's questions regarding telemedicine have been answered.     If the visit was completed in an ambulatory setting, the patient and/or parent/guardian  has also been advised to contact their provider???s office for worsening conditions, and seek emergency medical treatment and/or call 911 if the patient deems either necessary.

## 2020-10-18 NOTE — Unmapped (Signed)
[facility] Internal Medicine   CARE MANAGEMENT ENCOUNTER           Date of Service:  10/18/2020      Service:  Care Coordination - phone  MyChart use by patient is active: yes    Purpose of contact:     Care Manager Intern (CMI) received request from Arlyn Dunning, Care Assistant to contact patient and notify him he is able to request a resupply of CPAP supplies from [facility]).    CMI completed the following related to the above request:  ??? Reviewed chart  ??? Contacted Lorane Gell and discussed the following  o Patient is able to contact [facility] [phone]) to receive a new mask and hose for his CPAP machine  o Gave him the phone number for [facility]  o Patient confirmed he would call for supplies    Patient was encouraged to reach out to their provider with any questions or concerns.    A copy of this Patient Outreach Encounter was sent to patient's Primary Care Provider.

## 2020-10-22 MED ORDER — ALBUTEROL SULFATE HFA 90 MCG/ACTUATION AEROSOL INHALER
Freq: Four times a day (QID) | RESPIRATORY_TRACT | 9 refills | 16.00000 days | PRN
Start: 2020-10-22 — End: ?

## 2020-10-23 MED ORDER — ROSUVASTATIN 10 MG TABLET
ORAL_TABLET | Freq: Every day | ORAL | 3 refills | 45 days
Start: 2020-10-23 — End: ?

## 2020-10-23 MED ORDER — EMPAGLIFLOZIN 10 MG TABLET
ORAL_TABLET | Freq: Every morning | ORAL | 3 refills | 30.00000 days
Start: 2020-10-23 — End: ?

## 2020-10-23 MED ORDER — ALBUTEROL SULFATE HFA 90 MCG/ACTUATION AEROSOL INHALER
RESPIRATORY_TRACT | 9 refills | 0.00000 days | Status: CP | PRN
Start: 2020-10-23 — End: ?
  Filled 2020-10-25: qty 18, 17d supply, fill #0

## 2020-10-25 MED ORDER — EMPAGLIFLOZIN 10 MG TABLET
ORAL_TABLET | Freq: Every morning | ORAL | 3 refills | 30.00000 days | Status: CP
Start: 2020-10-25 — End: ?
  Filled 2020-10-29: qty 30, 30d supply, fill #0

## 2020-10-25 MED ORDER — ROSUVASTATIN 10 MG TABLET
ORAL_TABLET | Freq: Every day | ORAL | 3 refills | 45 days | Status: CP
Start: 2020-10-25 — End: ?
  Filled 2020-10-25: qty 90, 45d supply, fill #0

## 2020-10-25 MED FILL — LOSARTAN 50 MG TABLET: ORAL | 45 days supply | Qty: 90 | Fill #1

## 2020-11-06 NOTE — Unmapped (Signed)
[facility] Internal Medicine   CARE MANAGEMENT ENCOUNTER           Date of Service:  11/06/2020      Service:  Care Coordination - phone  MyChart use by patient is active: yes    Purpose of contact:     [employer] Fellow (MS) received request from Dr. Irena Cords to contact [facility] Specialist to fix patient's broken CPAP machine as the note has been addend.     [employer] Fellow (MS) completed the following related to the above request:  ??? Reviewed chart  ??? Identified resources: Note was addended 6/13.     MS contacted [facility] Specialist   o Rep sent the orders over the [facility] department to see if the documentation is acceptable and said he will call [facility] Internal Medicine if there's any issues.    Sent information to Orville Govern, MD and Dr. Irena Cords    Provider/Care Partner(s)  to follow up on:   N/A    A copy of this Patient Outreach Encounter was sent to patient's Primary Care Provider

## 2020-11-19 NOTE — Unmapped (Signed)
Appointment was cancelled for October. Patient will get CBC this week per Dr. Cherly Hensen recommendations. He reports that he has weaned off prednisone.

## 2020-11-27 NOTE — Unmapped (Addendum)
Well message has been sent   ----- Message from Orville Govern, MD sent at 11/26/2020  5:33 PM EST -----  Regarding: FW: new cpap setup  Hello,    Could you please assist me in scheduling Robert Howell with a visit for me regarding his CPAP machine within the dates listed below?    Thank you so much,    Orville Govern MD, PGY-2  [facility] Internal Medicine Residency    ----- Message -----  From: Lonna Cobb, RRT  Sent: 11/26/2020   3:40 PM EST  To: Orville Govern, MD  Subject: new cpap setup                                   This is an update to inform you of the CPAP set-up was completed ????11????/??14???? /??22 ??????for??Robert Howell ____________???????????? .??   This patient will need a face-to-face appt. scheduled with you no sooner than 31 days from ??CPAP set-up date and no later than 91 days from CPAP set-up date.  [facility] respiratory therapists will be emailing the ordering providers with CPAP/BIPAP set-dates in an effort to help coordinate patients get their face-to-face follow-up appointments scheduled by their providers.     Please let me know if you have any questions or need anything further.

## 2020-11-29 ENCOUNTER — Ambulatory Visit: Admit: 2020-11-29 | Discharge: 2020-11-30 | Payer: PRIVATE HEALTH INSURANCE

## 2020-11-29 LAB — CBC
HEMATOCRIT: 46.5 % (ref 39.0–48.0)
HEMOGLOBIN: 15 g/dL (ref 12.9–16.5)
MEAN CORPUSCULAR HEMOGLOBIN CONC: 32.2 g/dL (ref 32.0–36.0)
MEAN CORPUSCULAR HEMOGLOBIN: 26.7 pg (ref 25.9–32.4)
MEAN CORPUSCULAR VOLUME: 83.1 fL (ref 77.6–95.7)
MEAN PLATELET VOLUME: 8 fL (ref 6.8–10.7)
PLATELET COUNT: 224 10*9/L (ref 150–450)
RED BLOOD CELL COUNT: 5.6 10*12/L (ref 4.26–5.60)
RED CELL DISTRIBUTION WIDTH: 14.3 % (ref 12.2–15.2)
WBC ADJUSTED: 5.3 10*9/L (ref 3.6–11.2)

## 2020-11-29 LAB — BASIC METABOLIC PANEL
ANION GAP: 6 mmol/L (ref 5–14)
BLOOD UREA NITROGEN: 9 mg/dL (ref 9–23)
BUN / CREAT RATIO: 9
CALCIUM: 9.3 mg/dL (ref 8.7–10.4)
CHLORIDE: 108 mmol/L — ABNORMAL HIGH (ref 98–107)
CO2: 26.6 mmol/L (ref 20.0–31.0)
CREATININE: 1.05 mg/dL
EGFR CKD-EPI (2021) MALE: 84 mL/min/{1.73_m2} (ref >=60)
GLUCOSE RANDOM: 127 mg/dL (ref 70–179)
POTASSIUM: 4.3 mmol/L (ref 3.4–4.8)
SODIUM: 141 mmol/L (ref 135–145)

## 2020-12-12 MED ORDER — METFORMIN 1,000 MG TABLET
ORAL_TABLET | Freq: Two times a day (BID) | ORAL | 3 refills | 90 days | Status: CP
Start: 2020-12-12 — End: 2021-12-12
  Filled 2020-12-13: qty 180, 90d supply, fill #0

## 2020-12-12 NOTE — Unmapped (Signed)
Specialty Medication(s): Mycophenolate 500mg     Mr.Epps has been dis-enrolled from the [facility] specialty pharmacy services due to Mr Justo reports he didn't like the medication and stopped taking it after 1-2 weeks. .    Additional information provided to the patient: He plans to discuss with provider at upcoming appointment.    Camillo Flaming  [facility] Specialty Pharmacist

## 2020-12-20 ENCOUNTER — Ambulatory Visit
Admission: EM | Admit: 2020-12-20 | Discharge: 2020-12-20 | Disposition: A | Discharge status: Home / Self Care | Payer: Medicaid Other

## 2020-12-20 ENCOUNTER — Other Ambulatory Visit: Payer: Self-pay

## 2020-12-20 DIAGNOSIS — I158 Other secondary hypertension: Secondary | ICD-10-CM

## 2020-12-20 NOTE — Discharge Instructions (Addendum)
Remember to take your blood pressure medication as soon as you get up

## 2020-12-20 NOTE — ED Triage Notes (Signed)
Patient presents to Urgent Care with complaints of elevated blood pressure. He states he missed 3 doses. His BP ranges in the 130's with meds. He is concerned with his blood pressure being in the 170's this morning. He has not taken his BP meds today.   Denies headache, changes in vision, or dizziness.

## 2020-12-20 NOTE — ED Provider Notes (Signed)
[facility]    CSN: [id number] Arrival date & time: 12/20/20  0321      History   Chief Complaint Chief Complaint  Patient presents with   Hypertension    HPI David Robles is a 55 y.o. male who presents concerned about his BP being elevated and missed 3 doses of his BP med. He states he normally takes it after breakfast but yesterday left without eating and checked and was 170/106. Normally the systolic is 130's. Denies HA or CP when was elevated. Pt has not taken his BP medication today.    Past Medical History:  Diagnosis Date   Anxiety    Diabetes mellitus without complication (HCC)    Diverticulitis    Pulmonary embolism (HCC) 2004   Sarcoidosis     Patient Active Problem List   Diagnosis Date Noted   Chest pain 07/29/2017   Sarcoidosis 07/29/2017   Diabetes (HCC) 07/29/2017    Past Surgical History:  Procedure Laterality Date   ABDOMINAL HERNIA REPAIR  2014   COLECTOMY  2004   COLOSTOMY REVISION  2004       Home Medications    Prior to Admission medications   Medication Sig Start Date End Date Taking? Authorizing Provider  empagliflozin (JARDIANCE) 10 MG TABS tablet Take by mouth. 10/25/20  Yes [provider]  fluticasone (FLONASE) 50 MCG/ACT nasal spray Use 1 spray in each nostril daily as needed. 08/22/20  Yes [provider]  losartan (COZAAR) 50 MG tablet Take by mouth. 06/25/20 12/22/20 Yes [provider]  metoprolol (TOPROL-XL) 200 MG 24 hr tablet Take 1 tablet by mouth at bedtime. 01/12/20 01/11/21 Yes [provider]  albuterol (PROVENTIL HFA;VENTOLIN HFA) 108 (90 Base) MCG/ACT inhaler Inhale into the lungs every 6 (six) hours as needed for wheezing or shortness of breath.    [provider]  candesartan (ATACAND) 8 MG tablet TAKE 1 TABLET BY MOUTH EVERY DAY IN THE MORNING 06/05/17   [provider]  meloxicam (MOBIC) 15 MG tablet Take 1 tablet (15 mg total) by mouth daily. 09/17/18    Cuthriell, Delorise Royals, PA-C  metFORMIN (GLUCOPHAGE) 500 MG tablet Take 500 mg by mouth 2 (two) times daily with a meal.    [provider]  rivaroxaban (XARELTO) 20 MG TABS tablet Take 20 mg by mouth daily with supper.    [provider]  rosuvastatin (CRESTOR) 10 MG tablet Take 10 mg by mouth daily.  02/11/17   [provider]    Family History Family History  Problem Relation Age of Onset   Stroke Father     Social History Social History   Tobacco Use   Smoking status: Some Days    Types: Cigarettes   Smokeless tobacco: Never  Vaping Use   Vaping Use: Never used  Substance Use Topics   Alcohol use: Yes    Comment: socially   Drug use: Yes    Frequency: 2.0 times per week    Types: Marijuana     Allergies   Patient has no known allergies.   Review of Systems Review of Systems Review of Systems  Constitutional: Negative for diaphoresis and unexpected weight change.  HENT: Negative for tinnitus.   Eyes: Negative for visual disturbance.  Respiratory: Negative for chest tightness and shortness of breath.   Cardiovascular: Negative for chest pain, palpitations and leg swelling.  Gastrointestinal: Negative for constipation, diarrhea and nausea.  Endocrine: Negative for polydipsia, polyphagia and polyuria.  Genitourinary: Negative  for dysuria and frequency.  Skin: Negative for rash and wound.  Neurological: Negative for dizziness, speech difficulty, weakness, numbness and headaches.    Physical Exam Triage Vital Signs ED Triage Vitals  Enc Vitals Group     BP 12/20/20 0902 129/89     Pulse Rate 12/20/20 0902 75     Resp 12/20/20 0902 16     Temp 12/20/20 0902 98.8 F (37.1 C)     Temp Source 12/20/20 0902 Oral     SpO2 12/20/20 0902 97 %     Weight --      Height --      Head Circumference --      Peak Flow --      Pain Score 12/20/20 0901 0     Pain Loc --      Pain Edu? --      Excl. in [place]? --    No data found.  Updated  Vital Signs BP 129/89 (BP Location: Left Arm)   Pulse 75   Temp 98.8 F (37.1 C) (Oral)   Resp 16   SpO2 97%   Visual Acuity Right Eye Distance:   Left Eye Distance:   Bilateral Distance:    Right Eye Near:   Left Eye Near:    Bilateral Near:     Physical Exam  Constitutional: She is oriented to person, place, and time. She appears well-developed and well-nourished. No distress.  HENT:  Head: Normocephalic and atraumatic.  Right Ear: External ear normal.  Left Ear: External ear normal.  Nose: Nose normal.  Eyes: Conjunctivae are normal. Right eye exhibits no discharge. Left eye exhibits no discharge. No scleral icterus.  Neck: Neck supple. No thyromegaly present.  No carotid bruits bilaterally  Cardiovascular: Normal rate and regular rhythm.  No murmur heard. Pulmonary/Chest: Effort normal and breath sounds normal. No respiratory distress.  Musculoskeletal: Normal range of motion. She exhibits no edema.  Lymphadenopathy:    She has no cervical adenopathy.  Neurological: She is alert and oriented to person, place, and time.  Skin: Skin is warm and dry. Capillary refill takes less than 2 seconds. No rash noted. She is not diaphoretic.  Psychiatric: She has a normal mood and affect. Her behavior is normal. Judgment and thought content normal.  Nursing note reviewed.   UC Treatments / Results  Labs (all labs ordered are listed, but only abnormal results are displayed) Labs Reviewed - No data to display  EKG   Radiology No results found.  Procedures Procedures (including critical care time)  Medications Ordered in UC Medications - No data to display  Initial Impression / Assessment and Plan / UC Course  I have reviewed the triage vital signs and the nursing notes. HTN  under control Pt advised to take his BP med right away after getting up. Has apt with PCP this month on the 20 th and will continue with PCP Fu as usual.      Final Clinical Impressions(s) /  UC Diagnoses   Final diagnoses:  None   Discharge Instructions   None    ED Prescriptions   None    PDMP not reviewed this encounter.   Garey Ham, [place] 12/20/20 [phone]

## 2020-12-24 MED FILL — JARDIANCE 10 MG TABLET: ORAL | 30 days supply | Qty: 30 | Fill #1

## 2020-12-24 MED FILL — LOSARTAN 50 MG TABLET: ORAL | 45 days supply | Qty: 90 | Fill #2

## 2020-12-25 ENCOUNTER — Ambulatory Visit
Admit: 2020-12-25 | Payer: PRIVATE HEALTH INSURANCE | Attending: Cardiovascular Disease | Primary: Cardiovascular Disease

## 2021-01-02 ENCOUNTER — Ambulatory Visit: Admit: 2021-01-02 | Payer: PRIVATE HEALTH INSURANCE

## 2021-01-14 ENCOUNTER — Ambulatory Visit: Admit: 2021-01-14 | Discharge: 2021-01-16 | Payer: PRIVATE HEALTH INSURANCE

## 2021-01-21 ENCOUNTER — Ambulatory Visit: Admit: 2021-01-21 | Discharge: 2021-01-22 | Payer: PRIVATE HEALTH INSURANCE

## 2021-01-21 DIAGNOSIS — E1100 Type 2 diabetes mellitus with hyperosmolarity without nonketotic hyperglycemic-hyperosmolar coma (NKHHC): Principal | ICD-10-CM

## 2021-01-21 MED ORDER — EMPAGLIFLOZIN 25 MG TABLET
ORAL_TABLET | Freq: Every morning | ORAL | 3 refills | 30 days | Status: CP
Start: 2021-01-21 — End: ?
  Filled 2021-01-25: qty 30, 30d supply, fill #0

## 2021-01-21 MED ORDER — ROSUVASTATIN 10 MG TABLET
ORAL_TABLET | Freq: Every day | ORAL | 3 refills | 22 days | Status: CP
Start: 2021-01-21 — End: ?
  Filled 2021-01-25: qty 120, 30d supply, fill #0

## 2021-01-24 ENCOUNTER — Ambulatory Visit: Admit: 2021-01-24 | Discharge: 2021-01-25 | Payer: PRIVATE HEALTH INSURANCE

## 2021-01-24 DIAGNOSIS — G4733 Obstructive sleep apnea (adult) (pediatric): Principal | ICD-10-CM

## 2021-01-24 DIAGNOSIS — D869 Sarcoidosis, unspecified: Principal | ICD-10-CM

## 2021-01-24 MED ORDER — CLOBETASOL 0.05 % TOPICAL OINTMENT
Freq: Two times a day (BID) | TOPICAL | 3 refills | 0 days | Status: CP
Start: 2021-01-24 — End: 2022-01-24
  Filled 2021-01-25: qty 15, 4d supply, fill #0

## 2021-01-27 ENCOUNTER — Ambulatory Visit
Admit: 2021-01-27 | Discharge: 2021-01-27 | Disposition: A | Discharge status: Home / Self Care | Payer: PRIVATE HEALTH INSURANCE

## 2021-01-27 DIAGNOSIS — R109 Unspecified abdominal pain: Principal | ICD-10-CM

## 2021-02-08 MED FILL — XARELTO 20 MG TABLET: ORAL | 90 days supply | Qty: 90 | Fill #1

## 2021-02-18 MED FILL — LOSARTAN 50 MG TABLET: ORAL | 45 days supply | Qty: 90 | Fill #3

## 2021-03-06 MED FILL — JARDIANCE 25 MG TABLET: ORAL | 30 days supply | Qty: 30 | Fill #1

## 2021-03-12 ENCOUNTER — Ambulatory Visit
Admit: 2021-03-12 | Payer: PRIVATE HEALTH INSURANCE | Attending: Cardiovascular Disease | Primary: Cardiovascular Disease

## 2021-04-08 MED ORDER — CETIRIZINE 10 MG TABLET
Freq: Every day | ORAL | 0 refills | 0 days
Start: 2021-04-08 — End: ?

## 2021-04-08 MED ORDER — ON CALL EXPRESS TEST STRIP
11 refills | 0 days | Status: CP
Start: 2021-04-08 — End: ?

## 2021-04-09 MED ORDER — CETIRIZINE 10 MG TABLET
ORAL_TABLET | Freq: Every day | ORAL | 2 refills | 90 days | Status: CP
Start: 2021-04-09 — End: ?
  Filled 2021-04-12: qty 30, 30d supply, fill #0

## 2021-04-12 MED FILL — JARDIANCE 25 MG TABLET: ORAL | 30 days supply | Qty: 30 | Fill #2

## 2021-04-15 MED ORDER — FLUTICASONE PROPIONATE 50 MCG/ACTUATION NASAL SPRAY,SUSPENSION
Freq: Every day | NASAL | 0 refills | 120 days | PRN
Start: 2021-04-15 — End: ?

## 2021-04-15 MED ORDER — LOSARTAN 50 MG TABLET
ORAL_TABLET | Freq: Every day | ORAL | 3 refills | 45 days | Status: CP
Start: 2021-04-15 — End: 2022-01-21
  Filled 2021-04-16: qty 90, 45d supply, fill #0

## 2021-04-16 MED FILL — ROSUVASTATIN 10 MG TABLET: ORAL | 30 days supply | Qty: 120 | Fill #1

## 2021-04-28 MED ORDER — BLOOD-GLUCOSE METER KIT WRAPPER
0 refills | 0.00000 days | Status: CP
Start: 2021-04-28 — End: 2021-04-28
  Filled 2021-04-29: qty 1, 90d supply, fill #0

## 2021-04-28 MED ORDER — LANCETS 30 GAUGE
3 refills | 0.00000 days | Status: CP
Start: 2021-04-28 — End: 2021-04-28
  Filled 2021-04-29: qty 100, 34d supply, fill #0

## 2021-04-28 MED ORDER — ON CALL EXPRESS TEST STRIP
11 refills | 0.00000 days | Status: CP
Start: 2021-04-28 — End: 2021-04-28
  Filled 2021-04-29: qty 100, 34d supply, fill #0

## 2021-05-16 MED FILL — JARDIANCE 25 MG TABLET: ORAL | 30 days supply | Qty: 30 | Fill #3

## 2021-06-07 ENCOUNTER — Emergency Department
Admit: 2021-06-07 | Discharge: 2021-06-07 | Disposition: A | Discharge status: Home / Self Care | Payer: PRIVATE HEALTH INSURANCE | Attending: Emergency Medicine

## 2021-06-07 ENCOUNTER — Ambulatory Visit
Admit: 2021-06-07 | Discharge: 2021-06-07 | Disposition: A | Discharge status: Home / Self Care | Payer: PRIVATE HEALTH INSURANCE | Attending: Emergency Medicine

## 2021-06-07 DIAGNOSIS — M25532 Pain in left wrist: Principal | ICD-10-CM

## 2021-06-07 MED ORDER — DICLOFENAC 1 % TOPICAL GEL
Freq: Four times a day (QID) | TOPICAL | 0 refills | 13 days | Status: CP
Start: 2021-06-07 — End: 2021-06-20

## 2021-06-07 MED ORDER — OXYCODONE 5 MG TABLET
ORAL_TABLET | ORAL | 0 refills | 2 days | Status: CP | PRN
Start: 2021-06-07 — End: 2021-06-12

## 2021-06-12 MED FILL — XARELTO 20 MG TABLET: ORAL | 90 days supply | Qty: 90 | Fill #2

## 2021-06-14 ENCOUNTER — Ambulatory Visit: Admit: 2021-06-14 | Discharge: 2021-06-15 | Payer: PRIVATE HEALTH INSURANCE

## 2021-06-14 DIAGNOSIS — I10 Essential (primary) hypertension: Principal | ICD-10-CM

## 2021-06-14 DIAGNOSIS — I998 Other disorder of circulatory system: Principal | ICD-10-CM

## 2021-06-14 MED ORDER — AMLODIPINE 5 MG TABLET
ORAL_TABLET | Freq: Every day | ORAL | 5 refills | 60 days | Status: CP
Start: 2021-06-14 — End: 2022-06-14

## 2021-06-19 MED ORDER — EMPAGLIFLOZIN 25 MG TABLET
ORAL_TABLET | Freq: Every morning | ORAL | 3 refills | 90 days | Status: CP
Start: 2021-06-19 — End: 2022-06-15
  Filled 2021-06-20: qty 90, 90d supply, fill #0

## 2021-07-01 MED FILL — LOSARTAN 50 MG TABLET: ORAL | 45 days supply | Qty: 90 | Fill #1

## 2021-07-15 ENCOUNTER — Ambulatory Visit: Admit: 2021-07-15 | Discharge: 2021-07-16 | Payer: PRIVATE HEALTH INSURANCE

## 2021-07-15 DIAGNOSIS — E1100 Type 2 diabetes mellitus with hyperosmolarity without nonketotic hyperglycemic-hyperosmolar coma (NKHHC): Principal | ICD-10-CM

## 2021-07-15 DIAGNOSIS — M2540 Effusion, unspecified joint: Principal | ICD-10-CM

## 2021-07-15 DIAGNOSIS — I10 Essential (primary) hypertension: Principal | ICD-10-CM

## 2021-07-15 MED ORDER — ROSUVASTATIN 40 MG TABLET
ORAL_TABLET | Freq: Every day | ORAL | 0 refills | 90 days | Status: CP
Start: 2021-07-15 — End: ?
  Filled 2021-07-24: qty 90, 90d supply, fill #0

## 2021-07-24 MED ORDER — METOPROLOL SUCCINATE ER 200 MG TABLET,EXTENDED RELEASE 24 HR
ORAL_TABLET | Freq: Every evening | ORAL | 3 refills | 90 days | Status: CP
Start: 2021-07-24 — End: 2022-07-24
  Filled 2021-07-25: qty 90, 90d supply, fill #0

## 2021-07-24 MED ORDER — FLUTICASONE PROPIONATE 50 MCG/ACTUATION NASAL SPRAY,SUSPENSION
Freq: Every day | NASAL | 0 refills | 120 days | PRN
Start: 2021-07-24 — End: ?

## 2021-07-25 MED FILL — VENTOLIN HFA 90 MCG/ACTUATION AEROSOL INHALER: RESPIRATORY_TRACT | 17 days supply | Qty: 18 | Fill #1

## 2021-07-31 ENCOUNTER — Ambulatory Visit: Admit: 2021-07-31 | Discharge: 2021-08-01 | Payer: PRIVATE HEALTH INSURANCE

## 2021-08-14 ENCOUNTER — Ambulatory Visit: Admit: 2021-08-14 | Discharge: 2021-08-15 | Payer: PRIVATE HEALTH INSURANCE

## 2021-08-14 DIAGNOSIS — M8700 Idiopathic aseptic necrosis of unspecified bone: Principal | ICD-10-CM

## 2021-08-14 DIAGNOSIS — D869 Sarcoidosis, unspecified: Principal | ICD-10-CM

## 2021-08-14 DIAGNOSIS — M25631 Stiffness of right wrist, not elsewhere classified: Principal | ICD-10-CM

## 2021-08-19 ENCOUNTER — Ambulatory Visit
Admit: 2021-08-19 | Discharge: 2021-08-20 | Payer: PRIVATE HEALTH INSURANCE | Attending: Student in an Organized Health Care Education/Training Program | Primary: Student in an Organized Health Care Education/Training Program

## 2021-08-19 DIAGNOSIS — D8685 Sarcoid myocarditis: Principal | ICD-10-CM

## 2021-08-19 MED ORDER — ASPIRIN 81 MG TABLET,DELAYED RELEASE
ORAL_TABLET | Freq: Every day | ORAL | 3 refills | 90 days | Status: CP
Start: 2021-08-19 — End: 2022-08-19
  Filled 2021-08-23: qty 90, 90d supply, fill #0

## 2021-08-20 MED ORDER — METFORMIN 1,000 MG TABLET
ORAL_TABLET | Freq: Two times a day (BID) | ORAL | 3 refills | 90 days
Start: 2021-08-20 — End: 2022-08-20

## 2021-08-21 MED ORDER — METFORMIN 1,000 MG TABLET
ORAL_TABLET | Freq: Two times a day (BID) | ORAL | 3 refills | 90 days
Start: 2021-08-21 — End: 2022-08-21

## 2021-08-29 ENCOUNTER — Ambulatory Visit: Admit: 2021-08-29 | Discharge: 2021-08-30 | Payer: PRIVATE HEALTH INSURANCE

## 2021-09-03 MED FILL — CLOBETASOL 0.05 % TOPICAL OINTMENT: TOPICAL | 4 days supply | Qty: 15 | Fill #1

## 2021-09-20 ENCOUNTER — Ambulatory Visit: Admit: 2021-09-20 | Discharge: 2021-09-21 | Payer: PRIVATE HEALTH INSURANCE

## 2021-09-21 ENCOUNTER — Ambulatory Visit: Admit: 2021-09-21 | Discharge: 2021-09-22 | Payer: PRIVATE HEALTH INSURANCE

## 2021-09-26 MED FILL — JARDIANCE 25 MG TABLET: ORAL | 60 days supply | Qty: 60 | Fill #1

## 2021-09-26 MED FILL — LOSARTAN 50 MG TABLET: ORAL | 45 days supply | Qty: 90 | Fill #2

## 2021-10-07 ENCOUNTER — Ambulatory Visit
Admit: 2021-10-07 | Discharge: 2021-10-08 | Payer: MEDICAID | Attending: Orthopaedic Surgery | Primary: Orthopaedic Surgery

## 2021-10-12 ENCOUNTER — Encounter: Payer: Self-pay | Admitting: Emergency Medicine

## 2021-10-12 ENCOUNTER — Ambulatory Visit
Admission: EM | Admit: 2021-10-12 | Discharge: 2021-10-12 | Disposition: A | Discharge status: Home / Self Care | Payer: Medicare Other | Attending: Physician Assistant | Admitting: Physician Assistant

## 2021-10-12 DIAGNOSIS — R0981 Nasal congestion: Secondary | ICD-10-CM | POA: Diagnosis present

## 2021-10-12 DIAGNOSIS — R051 Acute cough: Secondary | ICD-10-CM

## 2021-10-12 DIAGNOSIS — Z1152 Encounter for screening for COVID-19: Secondary | ICD-10-CM | POA: Diagnosis present

## 2021-10-12 DIAGNOSIS — U071 COVID-19: Secondary | ICD-10-CM | POA: Diagnosis present

## 2021-10-12 LAB — SARS CORONAVIRUS 2 BY RT PCR: SARS Coronavirus 2 by RT PCR: POSITIVE — AB

## 2021-10-12 MED ORDER — IPRATROPIUM BROMIDE 0.06 % NA SOLN: 2.0000 | Freq: Four times a day (QID) | NASAL | 0 refills | Status: DC

## 2021-10-12 MED ORDER — MOLNUPIRAVIR 200 MG PO CAPS: 4.0000 | ORAL_CAPSULE | Freq: Two times a day (BID) | ORAL | 0 refills | Status: AC

## 2021-10-12 MED ORDER — PROMETHAZINE-DM 6.25-15 MG/5ML PO SYRP: 5.0000 mL | ORAL_SOLUTION | Freq: Four times a day (QID) | ORAL | 0 refills | Status: DC | PRN

## 2021-10-12 NOTE — ED Triage Notes (Signed)
Patient c/o cough, runny nose, and congestion that started yesterday.  Patient denies fevers.

## 2021-10-12 NOTE — ED Provider Notes (Signed)
[facility]    CSN: [id number] Arrival date & time: 10/12/21  0830      History   Chief Complaint Chief Complaint  Patient presents with   Cough   Nasal Congestion    HPI David Robles is a 56 y.o. male presenting for onset of fatigue, cough, mild throat, and congestion/runny nose yesterday.  He denies fever, body aches, ear pain, sinus pain, chest pain, breathing faulty, vomiting or diarrhea.  Reports that his mother has similar symptoms and she was seen but he does not know what she was diagnosed with.  He has been taking TheraFlu for symptoms.  His medical history significant for diabetes, sarcoidosis and history of PE.  HPI  Past Medical History:  Diagnosis Date   Anxiety    Diabetes mellitus without complication (HCC)    Diverticulitis    Pulmonary embolism (HCC) 2004   Sarcoidosis     Patient Active Problem List   Diagnosis Date Noted   Chest pain 07/29/2017   Sarcoidosis 07/29/2017   Diabetes (HCC) 07/29/2017    Past Surgical History:  Procedure Laterality Date   ABDOMINAL HERNIA REPAIR  2014   COLECTOMY  2004   COLOSTOMY REVISION  2004       Home Medications    Prior to Admission medications   Medication Sig Start Date End Date Taking? Authorizing Provider  aspirin EC 81 MG tablet Take 1 tablet by mouth daily. 08/19/21 08/19/22 Yes [provider]  candesartan (ATACAND) 8 MG tablet TAKE 1 TABLET BY MOUTH EVERY DAY IN THE MORNING 06/05/17  Yes [provider]  empagliflozin (JARDIANCE) 10 MG TABS tablet Take by mouth. 10/25/20  Yes [provider]  ipratropium (ATROVENT) 0.06 % nasal spray Place 2 sprays into both nostrils 4 (four) times daily. 10/12/21  Yes Eusebio Friendly B, PA-C  losartan (COZAAR) 50 MG tablet Take by mouth. 06/25/20 10/12/21 Yes [provider]  metoprolol (TOPROL-XL) 200 MG 24 hr tablet Take 1 tablet by mouth at bedtime. 01/12/20 10/12/21 Yes [provider]  molnupiravir EUA  (LAGEVRIO) 200 MG CAPS capsule Take 4 capsules (800 mg total) by mouth 2 (two) times daily for 5 days. 10/12/21 10/17/21 Yes Shirlee Latch, PA-C  promethazine-dextromethorphan (PROMETHAZINE-DM) 6.25-15 MG/5ML syrup Take 5 mLs by mouth 4 (four) times daily as needed. 10/12/21  Yes Shirlee Latch, PA-C  rivaroxaban (XARELTO) 20 MG TABS tablet Take 20 mg by mouth daily with supper.   Yes [provider]  rosuvastatin (CRESTOR) 10 MG tablet Take 10 mg by mouth daily.  02/11/17  Yes [provider]  albuterol (PROVENTIL HFA;VENTOLIN HFA) 108 (90 Base) MCG/ACT inhaler Inhale into the lungs every 6 (six) hours as needed for wheezing or shortness of breath.    [provider]  fluticasone (FLONASE) 50 MCG/ACT nasal spray Use 1 spray in each nostril daily as needed. 08/22/20   [provider]  metFORMIN (GLUCOPHAGE) 500 MG tablet Take 500 mg by mouth 2 (two) times daily with a meal.    [provider]    Family History Family History  Problem Relation Age of Onset   Stroke Father     Social History Social History   Tobacco Use   Smoking status: Former    Types: Cigarettes   Smokeless tobacco: Never  Vaping Use   Vaping Use: Never used  Substance Use Topics   Alcohol use: Yes    Comment: socially   Drug use: Yes  Frequency: 2.0 times per week    Types: Marijuana     Allergies   Patient has no known allergies.   Review of Systems Review of Systems  Constitutional:  Positive for fatigue. Negative for fever.  HENT:  Positive for congestion, rhinorrhea and sore throat. Negative for sinus pressure and sinus pain.   Respiratory:  Positive for cough. Negative for shortness of breath.   Cardiovascular:  Negative for chest pain.  Gastrointestinal:  Negative for abdominal pain, diarrhea, nausea and vomiting.  Musculoskeletal:  Negative for myalgias.  Neurological:  Negative for weakness, light-headedness and headaches.  Hematological:  Negative  for adenopathy.     Physical Exam Triage Vital Signs ED Triage Vitals  Enc Vitals Group     BP      Pulse      Resp      Temp      Temp src      SpO2      Weight      Height      Head Circumference      Peak Flow      Pain Score      Pain Loc      Pain Edu?      Excl. in [place]?    No data found.  Updated Vital Signs BP 125/85 (BP Location: Left Arm)   Pulse 66   Temp 98.4 F (36.9 C) (Oral)   Resp 15   Ht 6\' 3"  (1.905 m)   Wt 240 lb (108.9 kg)   SpO2 97%   BMI 30.00 kg/m   Physical Exam Vitals and nursing note reviewed.  Constitutional:      General: He is not in acute distress.    Appearance: Normal appearance. He is well-developed. He is not ill-appearing.  HENT:     Head: Normocephalic and atraumatic.     Nose: Congestion present.     Mouth/Throat:     Mouth: Mucous membranes are moist.     Pharynx: Oropharynx is clear.  Eyes:     General: No scleral icterus.    Conjunctiva/sclera: Conjunctivae normal.  Cardiovascular:     Rate and Rhythm: Normal rate and regular rhythm.  Pulmonary:     Effort: Pulmonary effort is normal. No respiratory distress.     Breath sounds: Normal breath sounds.  Musculoskeletal:     Cervical back: Neck supple.  Skin:    General: Skin is warm and dry.     Capillary Refill: Capillary refill takes less than 2 seconds.  Neurological:     General: No focal deficit present.     Mental Status: He is alert. Mental status is at baseline.     Motor: No weakness.     Gait: Gait normal.  Psychiatric:        Mood and Affect: Mood normal.        Behavior: Behavior normal.      UC Treatments / Results  Labs (all labs ordered are listed, but only abnormal results are displayed) Labs Reviewed  SARS CORONAVIRUS 2 BY RT PCR - Abnormal; Notable for the following components:      Result Value   SARS Coronavirus 2 by RT PCR POSITIVE (*)    All other components within normal limits    EKG   Radiology No results  found.  Procedures Procedures (including critical care time)  Medications Ordered in UC Medications - No data to display  Initial Impression / Assessment and Plan / UC Course  I have reviewed the triage vital signs and the nursing notes.  Pertinent labs & imaging results that were available during my care of the patient were reviewed by me and considered in my medical decision making (see chart for details).   56 year old male presents for cough, congestion, sore throat that started yesterday.  Mother has similar symptoms.  She was negative for COVID.  His vitals are normal and stable.  He is mildly ill-appearing but nontoxic.  He has significant nasal congestion.  Chest clear to auscultation heart regular rate rhythm.  PCR COVID test performed.  COVID test is positive.  Will start patient on molnupiravir.  Reviewed current CDC guidelines, isolation protocol and ED precautions.  Also sent Promethazine DM and Atrovent nasal spray to pharmacy.  Encouraged plenty of rest and fluids.  Reviewed returning for any acute worsening of symptoms.   Final Clinical Impressions(s) / UC Diagnoses   Final diagnoses:  COVID-19  Acute cough  Nasal congestion  Encounter for screening for COVID-19     Discharge Instructions      -Your COVID test is positive.  You need to isolate 5 days and wear mask for 5 days.  This means you need to isolate until October 5 and then wear your mask through October 10.  I have sent an antiviral medication to the pharmacy as well as a cough medication and a nasal spray.  Increase your rest and fluids. - Return if you have any uncontrollable fever, weakness, breathing difficulty, etc.     ED Prescriptions     Medication Sig Dispense Auth. Provider   molnupiravir EUA (LAGEVRIO) 200 MG CAPS capsule Take 4 capsules (800 mg total) by mouth 2 (two) times daily for 5 days. 40 capsule Eusebio Friendly B, PA-C   ipratropium (ATROVENT) 0.06 % nasal spray Place 2 sprays into  both nostrils 4 (four) times daily. 15 mL Eusebio Friendly B, PA-C   promethazine-dextromethorphan (PROMETHAZINE-DM) 6.25-15 MG/5ML syrup Take 5 mLs by mouth 4 (four) times daily as needed. 118 mL Shirlee Latch, PA-C      PDMP not reviewed this encounter.   Shirlee Latch, PA-C 10/12/21 [phone]

## 2021-10-12 NOTE — Discharge Instructions (Addendum)
-  Your COVID test is positive.  You need to isolate 5 days and wear mask for 5 days.  This means you need to isolate until October 5 and then wear your mask through October 10.  I have sent an antiviral medication to the pharmacy as well as a cough medication and a nasal spray.  Increase your rest and fluids. - Return if you have any uncontrollable fever, weakness, breathing difficulty, etc.

## 2021-10-14 MED ORDER — XARELTO 20 MG TABLET
ORAL_TABLET | Freq: Every day | ORAL | 3 refills | 90 days
Start: 2021-10-14 — End: 2022-10-14

## 2021-10-21 MED ORDER — ACCU-CHEK GUIDE TEST STRIPS
Freq: Three times a day (TID) | 11 refills | 34 days | Status: CP
Start: 2021-10-21 — End: ?

## 2021-10-30 ENCOUNTER — Ambulatory Visit: Admit: 2021-10-30 | Discharge: 2021-10-31 | Payer: MEDICARE

## 2021-10-30 DIAGNOSIS — E7800 Pure hypercholesterolemia, unspecified: Principal | ICD-10-CM

## 2021-10-30 DIAGNOSIS — I10 Essential (primary) hypertension: Principal | ICD-10-CM

## 2021-10-30 DIAGNOSIS — R931 Abnormal findings on diagnostic imaging of heart and coronary circulation: Principal | ICD-10-CM

## 2021-10-30 DIAGNOSIS — G4733 Obstructive sleep apnea (adult) (pediatric): Principal | ICD-10-CM

## 2021-10-30 DIAGNOSIS — M87037 Idiopathic aseptic necrosis of right carpus: Principal | ICD-10-CM

## 2021-10-30 DIAGNOSIS — E119 Type 2 diabetes mellitus without complications: Principal | ICD-10-CM

## 2021-10-30 MED ORDER — RIVAROXABAN 20 MG TABLET
ORAL_TABLET | Freq: Every day | ORAL | 3 refills | 90 days | Status: CP
Start: 2021-10-30 — End: 2022-10-30
  Filled 2021-10-31: qty 60, 60d supply, fill #0

## 2021-10-30 MED ORDER — ACCU-CHEK GUIDE TEST STRIPS
Freq: Three times a day (TID) | 11 refills | 34 days | Status: CP
Start: 2021-10-30 — End: ?

## 2021-10-30 MED ORDER — ON CALL EXPRESS TEST STRIP
Freq: Three times a day (TID) | 11 refills | 34 days | Status: CP
Start: 2021-10-30 — End: 2022-10-30

## 2021-10-30 MED ORDER — SHINGRIX (PF) 50 MCG/0.5 ML INTRAMUSCULAR SUSPENSION, KIT
Freq: Once | INTRAMUSCULAR | 1 refills | 1 days | Status: CP
Start: 2021-10-30 — End: 2021-10-30

## 2021-11-26 MED ORDER — ALBUTEROL SULFATE HFA 90 MCG/ACTUATION AEROSOL INHALER
RESPIRATORY_TRACT | 9 refills | 17 days | PRN
Start: 2021-11-26 — End: ?

## 2021-11-26 MED ORDER — FLUTICASONE PROPIONATE 50 MCG/ACTUATION NASAL SPRAY,SUSPENSION
Freq: Every day | NASAL | 0 refills | 120 days | PRN
Start: 2021-11-26 — End: ?

## 2021-11-28 MED ORDER — ALBUTEROL SULFATE HFA 90 MCG/ACTUATION AEROSOL INHALER
RESPIRATORY_TRACT | 9 refills | 17 days | Status: CP | PRN
Start: 2021-11-28 — End: ?
  Filled 2021-11-29: qty 18, 17d supply, fill #0

## 2021-11-28 MED ORDER — FLUTICASONE PROPIONATE 50 MCG/ACTUATION NASAL SPRAY,SUSPENSION
Freq: Every day | NASAL | 0 refills | 120 days | Status: CP | PRN
Start: 2021-11-28 — End: ?
  Filled 2021-11-29: qty 16, 60d supply, fill #0

## 2021-12-10 MED ORDER — ROSUVASTATIN 40 MG TABLET
ORAL_TABLET | Freq: Every day | ORAL | 2 refills | 90 days | Status: CP
Start: 2021-12-10 — End: 2022-10-31
  Filled 2021-12-12: qty 90, 90d supply, fill #0

## 2021-12-12 MED FILL — LOSARTAN 50 MG TABLET: ORAL | 45 days supply | Qty: 90 | Fill #3

## 2021-12-12 MED FILL — ASPIRIN 81 MG TABLET,DELAYED RELEASE: ORAL | 90 days supply | Qty: 90 | Fill #1

## 2021-12-12 MED FILL — METOPROLOL SUCCINATE ER 200 MG TABLET,EXTENDED RELEASE 24 HR: ORAL | 60 days supply | Qty: 60 | Fill #1

## 2021-12-12 MED FILL — CLOBETASOL 0.05 % TOPICAL OINTMENT: TOPICAL | 4 days supply | Qty: 15 | Fill #2

## 2021-12-12 MED FILL — JARDIANCE 25 MG TABLET: ORAL | 60 days supply | Qty: 60 | Fill #2

## 2021-12-12 MED FILL — XARELTO 20 MG TABLET: ORAL | 60 days supply | Qty: 60 | Fill #1

## 2021-12-16 ENCOUNTER — Ambulatory Visit
Admit: 2021-12-16 | Discharge: 2021-12-17 | Payer: MEDICARE | Attending: Student in an Organized Health Care Education/Training Program | Primary: Student in an Organized Health Care Education/Training Program

## 2021-12-16 DIAGNOSIS — D8685 Sarcoid myocarditis: Principal | ICD-10-CM

## 2021-12-21 ENCOUNTER — Emergency Department
Admit: 2021-12-21 | Discharge: 2021-12-21 | Disposition: A | Discharge status: Home / Self Care | Payer: MEDICARE | Attending: Student in an Organized Health Care Education/Training Program

## 2021-12-21 ENCOUNTER — Ambulatory Visit
Admit: 2021-12-21 | Discharge: 2021-12-21 | Disposition: A | Discharge status: Home / Self Care | Payer: MEDICARE | Attending: Student in an Organized Health Care Education/Training Program

## 2021-12-21 DIAGNOSIS — M62838 Other muscle spasm: Principal | ICD-10-CM

## 2021-12-21 DIAGNOSIS — M545 Left-sided low back pain without sciatica, unspecified chronicity: Principal | ICD-10-CM

## 2022-01-09 MED ORDER — ACCU-CHEK GUIDE TEST STRIPS
Freq: Three times a day (TID) | 11 refills | 34 days | Status: CP
Start: 2022-01-09 — End: ?

## 2022-02-17 ENCOUNTER — Ambulatory Visit: Admit: 2022-02-17 | Discharge: 2022-02-18 | Payer: MEDICARE

## 2022-02-17 ENCOUNTER — Institutional Professional Consult (permissible substitution): Admit: 2022-02-17 | Discharge: 2022-02-18 | Payer: MEDICARE

## 2022-02-17 DIAGNOSIS — D8685 Sarcoid myocarditis: Principal | ICD-10-CM

## 2022-02-19 ENCOUNTER — Ambulatory Visit: Admit: 2022-02-19 | Discharge: 2022-02-20 | Disposition: A | Discharge status: Home / Self Care | Payer: MEDICARE

## 2022-02-19 ENCOUNTER — Emergency Department: Admit: 2022-02-19 | Discharge: 2022-02-20 | Disposition: A | Discharge status: Home / Self Care | Payer: MEDICARE

## 2022-02-19 DIAGNOSIS — R002 Palpitations: Principal | ICD-10-CM

## 2022-02-27 DIAGNOSIS — E1100 Type 2 diabetes mellitus with hyperosmolarity without nonketotic hyperglycemic-hyperosmolar coma (NKHHC): Principal | ICD-10-CM

## 2022-02-27 MED ORDER — LANCETS
Freq: Three times a day (TID) | 3 refills | 34 days | Status: CP
Start: 2022-02-27 — End: ?

## 2022-02-27 MED ORDER — ACCU-CHEK GUIDE TEST STRIPS
Freq: Three times a day (TID) | 11 refills | 34 days | Status: CP
Start: 2022-02-27 — End: ?

## 2022-03-15 MED ORDER — EMPAGLIFLOZIN 25 MG TABLET
ORAL_TABLET | Freq: Every morning | ORAL | 3 refills | 90 days
Start: 2022-03-15 — End: 2023-03-11

## 2022-03-16 MED FILL — METOPROLOL SUCCINATE ER 200 MG TABLET,EXTENDED RELEASE 24 HR: ORAL | 90 days supply | Qty: 90 | Fill #2

## 2022-03-16 MED FILL — ROSUVASTATIN 40 MG TABLET: ORAL | 90 days supply | Qty: 90 | Fill #1

## 2022-03-16 MED FILL — XARELTO 20 MG TABLET: ORAL | 60 days supply | Qty: 60 | Fill #2

## 2022-03-17 MED ORDER — EMPAGLIFLOZIN 25 MG TABLET
ORAL_TABLET | Freq: Every morning | ORAL | 3 refills | 90 days | Status: CP
Start: 2022-03-17 — End: 2023-03-13
  Filled 2022-03-19: qty 90, 90d supply, fill #0

## 2022-05-02 ENCOUNTER — Ambulatory Visit: Admit: 2022-05-02 | Payer: MEDICARE

## 2022-06-16 ENCOUNTER — Ambulatory Visit: Admit: 2022-06-16 | Discharge: 2022-06-17 | Payer: MEDICARE | Attending: Adult Health | Primary: Adult Health

## 2022-06-16 DIAGNOSIS — I130 Hypertensive heart and chronic kidney disease with heart failure and stage 1 through stage 4 chronic kidney disease, or unspecified chronic kidney disease: Principal | ICD-10-CM

## 2022-06-16 DIAGNOSIS — E119 Type 2 diabetes mellitus without complications: Principal | ICD-10-CM

## 2022-06-16 DIAGNOSIS — R931 Abnormal findings on diagnostic imaging of heart and coronary circulation: Principal | ICD-10-CM

## 2022-06-16 DIAGNOSIS — I2510 Atherosclerotic heart disease of native coronary artery without angina pectoris: Principal | ICD-10-CM

## 2022-06-16 DIAGNOSIS — D8685 Sarcoid myocarditis: Principal | ICD-10-CM

## 2022-06-16 DIAGNOSIS — I472 Ventricular tachycardia (CMS-HCC): Principal | ICD-10-CM

## 2022-06-16 MED ORDER — LOSARTAN 50 MG TABLET
ORAL_TABLET | Freq: Every day | ORAL | 3 refills | 90 days | Status: CP
Start: 2022-06-16 — End: 2023-06-16
  Filled 2022-06-17: qty 90, 90d supply, fill #0

## 2022-06-16 MED ORDER — ATORVASTATIN 40 MG TABLET
ORAL_TABLET | Freq: Every day | ORAL | 3 refills | 90 days | Status: CP
Start: 2022-06-16 — End: 2023-06-16
  Filled 2022-06-17: qty 90, 90d supply, fill #0

## 2022-08-06 MED FILL — XARELTO 20 MG TABLET: ORAL | 60 days supply | Qty: 60 | Fill #3

## 2022-08-06 MED FILL — JARDIANCE 25 MG TABLET: ORAL | 90 days supply | Qty: 90 | Fill #1

## 2022-10-13 ENCOUNTER — Ambulatory Visit: Admit: 2022-10-13 | Discharge: 2022-10-14 | Payer: MEDICARE

## 2022-10-13 DIAGNOSIS — I493 Ventricular premature depolarization: Principal | ICD-10-CM

## 2022-10-13 DIAGNOSIS — I472 Ventricular tachycardia (CMS-HCC): Principal | ICD-10-CM

## 2022-11-05 ENCOUNTER — Ambulatory Visit
Admission: EM | Admit: 2022-11-05 | Discharge: 2022-11-05 | Disposition: A | Discharge status: Home / Self Care | Payer: Medicare Other | Attending: Family Medicine | Admitting: Family Medicine

## 2022-11-05 DIAGNOSIS — Z599 Problem related to housing and economic circumstances, unspecified: Secondary | ICD-10-CM | POA: Diagnosis present

## 2022-11-05 DIAGNOSIS — K0889 Other specified disorders of teeth and supporting structures: Secondary | ICD-10-CM | POA: Diagnosis present

## 2022-11-05 DIAGNOSIS — J029 Acute pharyngitis, unspecified: Secondary | ICD-10-CM | POA: Diagnosis present

## 2022-11-05 LAB — GROUP A STREP BY PCR: Group A Strep by PCR: NOT DETECTED

## 2022-11-05 MED ORDER — AMOXICILLIN-POT CLAVULANATE 875-125 MG PO TABS: 1.0000 | ORAL_TABLET | Freq: Two times a day (BID) | ORAL | 0 refills | Status: DC

## 2022-11-05 NOTE — ED Provider Notes (Signed)
[facility]    CSN: [id number] Arrival date & time: 11/05/22  1836      History   Chief Complaint Chief Complaint  Patient presents with   Sore Throat   Headache    HPI David Robles is a 57 y.o. male.   HPI  History obtained from {source of history:310783}. David Robles presents for sore throat and headache for the past 2 days. Feels inflammation in his gums but    Fever : no  Chills: no Sore throat: no   Cough: no Sputum: no Chest tightness: no Shortness of breath: no Wheezing: no  Nasal congestion : no  Rhinorrhea: no Myalgias: no Appetite: normal  Hydration: normal  Abdominal pain: no Nausea: no Vomiting: no Diarrhea: No Rash: No Sleep disturbance: no Headache: no      Past Medical History:  Diagnosis Date   Anxiety    Diabetes mellitus without complication (HCC)    Diverticulitis    Pulmonary embolism (HCC) 2004   Sarcoidosis     Patient Active Problem List   Diagnosis Date Noted   Chest pain 07/29/2017   Sarcoidosis 07/29/2017   Diabetes (HCC) 07/29/2017    Past Surgical History:  Procedure Laterality Date   ABDOMINAL HERNIA REPAIR  2014   COLECTOMY  2004   COLOSTOMY REVISION  2004       Home Medications    Prior to Admission medications   Medication Sig Start Date End Date Taking? Authorizing Provider  albuterol (PROVENTIL HFA;VENTOLIN HFA) 108 (90 Base) MCG/ACT inhaler Inhale into the lungs every 6 (six) hours as needed for wheezing or shortness of breath.   Yes [provider]  atorvastatin (LIPITOR) 40 MG tablet Take 1 tablet by mouth daily. 06/16/22 06/16/23 Yes [provider]  candesartan (ATACAND) 8 MG tablet TAKE 1 TABLET BY MOUTH EVERY DAY IN THE MORNING 06/05/17  Yes [provider]  empagliflozin (JARDIANCE) 10 MG TABS tablet Take by mouth. 10/25/20  Yes [provider]  losartan (COZAAR) 50 MG tablet Take by mouth. 06/25/20 11/05/22 Yes [provider]  metFORMIN  (GLUCOPHAGE) 500 MG tablet Take 500 mg by mouth 2 (two) times daily with a meal.   Yes [provider]  metoprolol (TOPROL-XL) 200 MG 24 hr tablet Take 1 tablet by mouth at bedtime. 01/12/20 11/05/22 Yes [provider]  rivaroxaban (XARELTO) 20 MG TABS tablet Take 20 mg by mouth daily with supper.   Yes [provider]  fluticasone (FLONASE) 50 MCG/ACT nasal spray Use 1 spray in each nostril daily as needed. 08/22/20   [provider]  ipratropium (ATROVENT) 0.06 % nasal spray Place 2 sprays into both nostrils 4 (four) times daily. 10/12/21   Shirlee Latch, PA-C  promethazine-dextromethorphan (PROMETHAZINE-DM) 6.25-15 MG/5ML syrup Take 5 mLs by mouth 4 (four) times daily as needed. 10/12/21   Eusebio Friendly B, PA-C  rosuvastatin (CRESTOR) 10 MG tablet Take 10 mg by mouth daily.  02/11/17   [provider]    Family History Family History  Problem Relation Age of Onset   Stroke Father     Social History Social History   Tobacco Use   Smoking status: Former    Types: Cigarettes   Smokeless tobacco: Never  Vaping Use   Vaping status: Never Used  Substance Use Topics   Alcohol use: Yes    Comment: socially   Drug use: Yes    Frequency: 2.0 times per week    Types: Marijuana  Allergies   Rosuvastatin   Review of Systems Review of Systems: negative unless otherwise stated in HPI.      Physical Exam Triage Vital Signs ED Triage Vitals  Encounter Vitals Group     BP 11/05/22 1908 (!) 147/95     Systolic BP Percentile --      Diastolic BP Percentile --      Pulse Rate 11/05/22 1908 84     Resp 11/05/22 1908 16     Temp 11/05/22 1908 99 F (37.2 C)     Temp Source 11/05/22 1908 Oral     SpO2 11/05/22 1908 98 %     Weight 11/05/22 1907 235 lb (106.6 kg)     Height 11/05/22 1907 6' (1.829 m)     Head Circumference --      Peak Flow --      Pain Score 11/05/22 1910 10     Pain Loc --      Pain Education --      Exclude  from Growth Chart --    No data found.  Updated Vital Signs BP (!) 147/95 (BP Location: Left Arm)   Pulse 84   Temp 99 F (37.2 C) (Oral)   Resp 16   Ht 6' (1.829 m)   Wt 106.6 kg   SpO2 98%   BMI 31.87 kg/m   Visual Acuity Right Eye Distance:   Left Eye Distance:   Bilateral Distance:    Right Eye Near:   Left Eye Near:    Bilateral Near:     Physical Exam GEN:     alert, non-toxic appearing male in no distress ***   HENT:  mucus membranes moist, oropharyngeal ***without lesions or ***erythema, no*** tonsillar hypertrophy or exudates, *** moderate erythematous edematous turbinates, ***clear nasal discharge, ***bilateral TM normal EYES:   pupils equal and reactive, ***no scleral injection or discharge NECK:  normal ROM, no ***lymphadenopathy, ***no meningismus   RESP:  no increased work of breathing, ***clear to auscultation bilaterally CVS:   regular rate ***and rhythm Skin:   warm and dry, no rash on visible skin***    UC Treatments / Results  Labs (all labs ordered are listed, but only abnormal results are displayed) Labs Reviewed  GROUP A STREP BY PCR    EKG   Radiology No results found.  Procedures Procedures (including critical care time)  Medications Ordered in UC Medications - No data to display  Initial Impression / Assessment and Plan / UC Course  I have reviewed the triage vital signs and the nursing notes.  Pertinent labs & imaging results that were available during my care of the patient were reviewed by me and considered in my medical decision making (see chart for details).       Pt is a 57 y.o. male who presents for *** days of respiratory symptoms. Juriel is ***afebrile here without recent antipyretics. Satting well on room air. Overall pt is ***non-toxic appearing, well hydrated, without respiratory distress. Pulmonary exam ***is unremarkable.  COVID testing obtained ***and was negative. ***Pt to quarantine until COVID test results or  longer if positive.  I will call patient with test results, if positive. History consistent with ***viral respiratory illness. Discussed symptomatic treatment.  Explained lack of efficacy of antibiotics in viral disease.  Typical duration of symptoms discussed.   Return and ED precautions given and voiced understanding. Discussed MDM, treatment plan and plan for follow-up with patient*** who agrees with plan.     Final  Clinical Impressions(s) / UC Diagnoses   Final diagnoses:  None   Discharge Instructions   None    ED Prescriptions   None    PDMP not reviewed this encounter.

## 2022-11-05 NOTE — ED Triage Notes (Signed)
Pt c/o sore throat & HA x2 days. Denies any fevers. Has tried tylenol w/o relief.

## 2022-11-05 NOTE — Discharge Instructions (Signed)
Your sore throat is not related to strep.  I suspect this may be viral.  Gargle with warm salt water several times a day and take ibuprofen for discomfort.  Drinking warm liquids can help with pain as well.  You can purchase some over-the-counter Chloraseptic spray and throat lozenges to help with discomfort.  At this time there is no abscess to be drained. You were prescribed an antibiotic. Please take this exactly as directed and do not stop taking it until the entire course of medicine is finished, even if you begin to feel better before finishing the course.  Schedule an appointment with your dentist or call local dentists to see if they take your insurance or can do a payment payment plan.  [facility] [facility] [employer] [facility] are options. Consider the [facility] dental school  free clinic operates from 6-9 pm on select Wednesdays:  [facility] [employer], [place] [address] [place], [place] [address] Parking Location: [place]

## 2022-11-06 MED ORDER — METOPROLOL SUCCINATE ER 200 MG TABLET,EXTENDED RELEASE 24 HR
ORAL_TABLET | Freq: Every evening | ORAL | 3 refills | 90 days | Status: CP
Start: 2022-11-06 — End: 2023-11-06
  Filled 2022-11-12: qty 90, 90d supply, fill #0

## 2022-11-06 MED ORDER — XARELTO 20 MG TABLET
ORAL_TABLET | Freq: Every day | ORAL | 3 refills | 90 days
Start: 2022-11-06 — End: 2023-11-06

## 2022-11-07 MED ORDER — XARELTO 20 MG TABLET
ORAL_TABLET | Freq: Every day | ORAL | 0 refills | 30 days | Status: CP
Start: 2022-11-07 — End: ?
  Filled 2022-11-12: qty 30, 30d supply, fill #0

## 2022-11-10 MED ORDER — RIVAROXABAN 20 MG TABLET
ORAL_TABLET | Freq: Every day | ORAL | 3 refills | 90 days
Start: 2022-11-10 — End: 2023-11-10

## 2022-11-10 MED ORDER — XARELTO 20 MG TABLET
ORAL_TABLET | Freq: Every day | ORAL | 3 refills | 0 days
Start: 2022-11-10 — End: ?

## 2022-11-13 MED ORDER — XARELTO 20 MG TABLET
ORAL_TABLET | Freq: Every day | ORAL | 3 refills | 90 days
Start: 2022-11-13 — End: ?

## 2022-11-14 ENCOUNTER — Ambulatory Visit: Admit: 2022-11-14 | Discharge: 2022-11-15 | Payer: MEDICARE

## 2022-11-14 DIAGNOSIS — G4733 Obstructive sleep apnea (adult) (pediatric): Principal | ICD-10-CM

## 2022-11-14 DIAGNOSIS — E119 Type 2 diabetes mellitus without complications: Principal | ICD-10-CM

## 2022-11-14 DIAGNOSIS — Z13220 Encounter for screening for lipoid disorders: Principal | ICD-10-CM

## 2022-11-14 DIAGNOSIS — E785 Hyperlipidemia, unspecified: Principal | ICD-10-CM

## 2022-11-14 DIAGNOSIS — R109 Unspecified abdominal pain: Principal | ICD-10-CM

## 2022-11-14 MED ORDER — DICLOFENAC 1 % TOPICAL GEL
Freq: Four times a day (QID) | TOPICAL | 3 refills | 13 days | Status: CP
Start: 2022-11-14 — End: 2023-03-14

## 2022-11-18 MED ORDER — ATORVASTATIN 40 MG TABLET
ORAL_TABLET | Freq: Every day | ORAL | 3 refills | 90 days | Status: CP
Start: 2022-11-18 — End: 2023-11-18

## 2022-12-15 ENCOUNTER — Ambulatory Visit
Admit: 2022-12-15 | Discharge: 2022-12-16 | Payer: MEDICARE | Attending: Student in an Organized Health Care Education/Training Program | Primary: Student in an Organized Health Care Education/Training Program

## 2022-12-15 DIAGNOSIS — D8685 Sarcoid myocarditis: Principal | ICD-10-CM

## 2022-12-15 DIAGNOSIS — I493 Ventricular premature depolarization: Principal | ICD-10-CM

## 2022-12-15 DIAGNOSIS — I10 Essential (primary) hypertension: Principal | ICD-10-CM

## 2022-12-15 DIAGNOSIS — I2510 Atherosclerotic heart disease of native coronary artery without angina pectoris: Principal | ICD-10-CM

## 2022-12-15 DIAGNOSIS — I5089 Other heart failure: Principal | ICD-10-CM

## 2022-12-15 DIAGNOSIS — R002 Palpitations: Principal | ICD-10-CM

## 2022-12-15 DIAGNOSIS — G4733 Obstructive sleep apnea (adult) (pediatric): Principal | ICD-10-CM

## 2023-01-20 MED FILL — LOSARTAN 50 MG TABLET: ORAL | 90 days supply | Qty: 90 | Fill #1

## 2023-02-12 ENCOUNTER — Ambulatory Visit: Admit: 2023-02-12 | Discharge: 2023-02-13 | Payer: MEDICARE

## 2023-02-12 DIAGNOSIS — L989 Disorder of the skin and subcutaneous tissue, unspecified: Principal | ICD-10-CM

## 2023-02-12 DIAGNOSIS — D869 Sarcoidosis, unspecified: Principal | ICD-10-CM

## 2023-02-12 DIAGNOSIS — M67911 Unspecified disorder of synovium and tendon, right shoulder: Principal | ICD-10-CM

## 2023-02-12 MED ORDER — XARELTO 20 MG TABLET
ORAL_TABLET | Freq: Every day | ORAL | 0 refills | 30.00 days
Start: 2023-02-12 — End: ?

## 2023-02-12 MED ORDER — RIVAROXABAN 20 MG TABLET
ORAL_TABLET | Freq: Every day | ORAL | 0 refills | 30.00 days | Status: CN
Start: 2023-02-12 — End: ?

## 2023-02-13 MED ORDER — RIVAROXABAN 20 MG TABLET
ORAL_TABLET | Freq: Every day | ORAL | 2 refills | 30.00 days | Status: CP
Start: 2023-02-13 — End: ?

## 2023-02-16 ENCOUNTER — Inpatient Hospital Stay: Admit: 2023-02-16 | Discharge: 2023-02-16 | Payer: MEDICARE

## 2023-02-17 DIAGNOSIS — E119 Type 2 diabetes mellitus without complications: Principal | ICD-10-CM

## 2023-02-25 ENCOUNTER — Ambulatory Visit
Admit: 2023-02-25 | Discharge: 2023-02-26 | Payer: MEDICARE | Attending: Student in an Organized Health Care Education/Training Program | Primary: Student in an Organized Health Care Education/Training Program

## 2023-02-25 DIAGNOSIS — R21 Rash and other nonspecific skin eruption: Principal | ICD-10-CM

## 2023-02-25 DIAGNOSIS — D869 Sarcoidosis, unspecified: Principal | ICD-10-CM

## 2023-02-25 MED ORDER — CLOBETASOL 0.05 % TOPICAL OINTMENT
5 refills | 0.00 days | Status: CP
Start: 2023-02-25 — End: ?

## 2023-03-03 ENCOUNTER — Ambulatory Visit
Admission: EM | Admit: 2023-03-03 | Discharge: 2023-03-03 | Disposition: A | Discharge status: Home / Self Care | Payer: Medicare Other | Attending: Emergency Medicine | Admitting: Emergency Medicine

## 2023-03-03 ENCOUNTER — Other Ambulatory Visit: Payer: Self-pay

## 2023-03-03 ENCOUNTER — Ambulatory Visit: Payer: Medicare Other

## 2023-03-03 ENCOUNTER — Emergency Department: Admit: 2023-03-03 | Discharge: 2023-03-03 | Disposition: A | Discharge status: Home / Self Care | Payer: MEDICARE

## 2023-03-03 DIAGNOSIS — I82441 Acute embolism and thrombosis of right tibial vein: Principal | ICD-10-CM

## 2023-03-03 DIAGNOSIS — Z760 Encounter for issue of repeat prescription: Secondary | ICD-10-CM

## 2023-03-03 DIAGNOSIS — M7121 Synovial cyst of popliteal space [Baker], right knee: Secondary | ICD-10-CM

## 2023-03-03 DIAGNOSIS — M79661 Pain in right lower leg: Secondary | ICD-10-CM

## 2023-03-03 DIAGNOSIS — I82461 Acute embolism and thrombosis of right calf muscular vein: Secondary | ICD-10-CM

## 2023-03-03 MED ORDER — RIVAROXABAN (XARELTO) VTE STARTER PACK (15 & 20 MG)
ORAL_TABLET | ORAL | 0 refills | Status: DC
  Filled 2023-03-03: qty 51, 28d supply, fill #0

## 2023-03-03 NOTE — Discharge Instructions (Addendum)
You have another blood clot in your right leg, and also a Baker's cyst.  This is usually left alone unless they become problematic as we discussed.  Go to the [facility] health community pharmacy at [facility] for your Xarelto.  They will help you find an affordable solution.  Center, [address], [address], [place], [place] [address]   [phone]  Follow-up with your primary care provider ASAP.  I believe you will need to register for Medicare part B for assistance in paying for your medications in the future.  To the ER for chest pain, sharp pain when he take a deep breath then, shortness of breath, coughing up blood, or for any concerns.

## 2023-03-03 NOTE — ED Triage Notes (Signed)
Pt c/o right calf knot and pain x4days  Pt states that he has been out of his xarelto for about a month.  Pt states that he has had lower right calf pain starting on 02/28/23 and is concerned  Pt denies leg redness or swelling and states that he only has a knot on the back of the leg and it is hurting.   Pt denies any new physical activity or injuries

## 2023-03-03 NOTE — ED Provider Notes (Signed)
HPI  SUBJECTIVE:  David Robles is a 58 y.o. male who presents with 4 days of right posterior calf pain described as crampy, dull and constant.  He states that he feels a "knot" posteriorly.  No changes physical activity, trauma, calf swelling, chest pain, shortness of breath, hemoptysis.  No recent immobilization, surgery in the past 4 weeks.  He has not tried anything for this.  No alleviating factors.  Symptoms are worse with plantarflexion/dorsiflexion and with walking.  He ran out of his Xarelto 1 month ago because his insurance no longer covers it.  He has attempted to reach his PCP, but has not yet back heard from them.  Patient has a past medical history of sarcoidosis, diabetes, cardiac sarcoidosis, hypertension, hyperlipidemia, coronary artery disease, VT/frequent PVCs, OSA, COPD, GERD, arthritis, anxiety, chronic PE, and right sided DVT.  Past Medical History:  Diagnosis Date   Anxiety    Diabetes mellitus without complication (HCC)    Diverticulitis    Pulmonary embolism (HCC) 2004   Sarcoidosis     Past Surgical History:  Procedure Laterality Date   ABDOMINAL HERNIA REPAIR  2014   COLECTOMY  2004   COLOSTOMY REVISION  2004    Family History  Problem Relation Age of Onset   Stroke Father     Social History   Tobacco Use   Smoking status: Former    Types: Cigarettes   Smokeless tobacco: Never  Vaping Use   Vaping status: Never Used  Substance Use Topics   Alcohol use: Yes    Comment: socially   Drug use: Yes    Frequency: 2.0 times per week    Types: Marijuana    No current facility-administered medications for this encounter.  Current Outpatient Medications:    albuterol (PROVENTIL HFA;VENTOLIN HFA) 108 (90 Base) MCG/ACT inhaler, Inhale into the lungs every 6 (six) hours as needed for wheezing or shortness of breath., Disp: , Rfl:    atorvastatin (LIPITOR) 40 MG tablet, Take 1 tablet by mouth daily., Disp: , Rfl:    candesartan (ATACAND) 8 MG tablet,  TAKE 1 TABLET BY MOUTH EVERY DAY IN THE MORNING, Disp: , Rfl: 2   empagliflozin (JARDIANCE) 10 MG TABS tablet, Take by mouth., Disp: , Rfl:    fluticasone (FLONASE) 50 MCG/ACT nasal spray, Use 1 spray in each nostril daily as needed., Disp: , Rfl:    RIVAROXABAN (XARELTO) VTE STARTER PACK (15 & 20 MG), Follow package directions: Take one 15mg  tablet by mouth twice a day. On day 22, switch to one 20mg  tablet once a day. Take with food., Disp: 51 each, Rfl: 0   rosuvastatin (CRESTOR) 10 MG tablet, Take 10 mg by mouth daily. , Disp: , Rfl:    losartan (COZAAR) 50 MG tablet, Take by mouth., Disp: , Rfl:    metFORMIN (GLUCOPHAGE) 500 MG tablet, Take 500 mg by mouth 2 (two) times daily with a meal., Disp: , Rfl:    metoprolol (TOPROL-XL) 200 MG 24 hr tablet, Take 1 tablet by mouth at bedtime., Disp: , Rfl:   Allergies  Allergen Reactions   Rosuvastatin Other (See Comments)     ROS  As noted in HPI.   Physical Exam  BP (!) 168/108 (BP Location: Left Arm)   Pulse 78   Temp 98.8 F (37.1 C) (Oral)   Ht 6' (1.829 m)   Wt 111.1 kg   SpO2 98%   BMI 33.23 kg/m   Constitutional: Well developed, well nourished, no acute distress  Eyes:  EOMI, conjunctiva normal bilaterally HENT: Normocephalic, atraumatic,mucus membranes moist Respiratory: Normal inspiratory effort Cardiovascular: Normal rate GI: nondistended skin: No rash, skin intact Musculoskeletal: Right posterior calf tenderness.  No palpable cord.  Negative Homans.  No pain with ankle range of motion.  No palpable mass.  No edema.  No medial thigh tenderness.  Right calf 43 cm left calf 42.3 cm. Neurologic: Alert & oriented x 3, no focal neuro deficits Psychiatric: Speech and behavior appropriate   ED Course   Medications - No data to display  Orders Placed This Encounter  Procedures   US Venous Img Lower Unilateral Right    Standing Status:   Standing    Number of Occurrences:   1    Reason for Exam (SYMPTOM  OR DIAGNOSIS  REQUIRED):   Right posterior calf pain.  History of right DVT, PE, off Xarelto for 1 month.    No results found for this or any previous visit (from the past 24 hours). US Venous Img Lower Unilateral Right Result Date: 03/03/2023 CLINICAL DATA:  58 year old male with history of right lower extremity deep vein thrombosis. EXAM: RIGHT LOWER EXTREMITY VENOUS DOPPLER ULTRASOUND TECHNIQUE: Gray-scale sonography with graded compression, as well as color Doppler and duplex ultrasound were performed to evaluate the right lower extremity deep venous systems from the level of the common femoral vein and including the common femoral, femoral, profunda femoral, popliteal and calf veins including the posterior tibial, peroneal and gastrocnemius veins when visible. Spectral Doppler was utilized to evaluate flow at rest and with distal augmentation maneuvers in the common femoral, femoral and popliteal veins. The contralateral common femoral vein was also evaluated for comparison. COMPARISON:  07/07/2014 FINDINGS: RIGHT LOWER EXTREMITY Common Femoral Vein: No evidence of thrombus. Normal compressibility, respiratory phasicity and response to augmentation. Central Greater Saphenous Vein: No evidence of thrombus. Normal compressibility and flow on color Doppler imaging. Central Profunda Femoral Vein: No evidence of thrombus. Normal compressibility and flow on color Doppler imaging. Femoral Vein: No evidence of thrombus. Normal compressibility, respiratory phasicity and response to augmentation. Popliteal Vein: No evidence of thrombus. Normal compressibility, respiratory phasicity and response to augmentation. Calf Veins: Evidence of expansile, occlusive, heterogeneously hypoechoic thrombus in the posterior tibial vein. The peroneal vein is patent. Other Findings: Multiloculated simple appearing fluid collection in the popliteal fossa compatible with Baker cyst. LEFT LOWER EXTREMITY Common Femoral Vein: No evidence of thrombus.  Normal compressibility, respiratory phasicity and response to augmentation. IMPRESSION: 1. Acute appearing deep vein thrombosis limited to the posterior tibial vein in the calf. No evidence of iliofemoral extension. 2. Multiloculated right Baker cyst. Marliss Coots, MD [facility] Electronically Signed   By: Marliss Coots M.D.   On: 03/03/2023 12:08    ED Clinical Impression  1. Acute deep vein thrombosis (DVT) of calf muscle vein of right lower extremity (HCC)   2. Right calf pain   3. Synovial cyst of right popliteal space   4. Medication refill      ED Assessment/Plan    Outside records extensively reviewed.  Additional medical history obtained as noted in HPI.  Outside labs reviewed.  GFR from December 2024 above 90.  Reviewed imaging report.  Positive DVT posterior tibial veins.  Positive Baker's cyst.  See ultrasound report for details.  Discussed results with patient while in department.  Patient has a recurrent DVT after being off his Xarelto for a month due to insurance reasons.  He has no respiratory  complaints, chest pain.  Researched multiple options for finding an affordable solution for the patient through [place], [email or web address] called [facility] OP pharmacy and discussed with pharmacist.  Pharmacy recommends restarting with starter pack and will work with him to find an affordable solution.  He is to follow-up with his PCP ASAP.  Discussed imaging, treatment plan and plan for follow-up with patient.  Return precautions given.  Spent 45 minutes in the care of this patient.  Discussed labs, imaging, MDM, treatment plan, and plan for follow-up with patient. Discussed sn/sx that should prompt return to the ED. patient agrees with plan.   Meds ordered this encounter  Medications   RIVAROXABAN (XARELTO) VTE STARTER PACK (15 & 20 MG)    Sig: Follow package directions: Take one 15mg  tablet by mouth twice a day. On  day 22, switch to one 20mg  tablet once a day. Take with food.    Dispense:  51 each    Refill:  0      *This clinic note was created using Scientist, clinical (histocompatibility and immunogenetics). Therefore, there may be occasional mistakes despite careful proofreading.  ?    Domenick Gong, MD 03/04/23 [phone]

## 2023-03-06 ENCOUNTER — Ambulatory Visit: Admit: 2023-03-06 | Discharge: 2023-03-07 | Payer: MEDICARE

## 2023-03-09 ENCOUNTER — Ambulatory Visit: Admit: 2023-03-09 | Discharge: 2023-03-10 | Payer: MEDICARE

## 2023-03-09 ENCOUNTER — Ambulatory Visit: Admit: 2023-03-09 | Discharge: 2023-03-10 | Payer: MEDICARE | Attending: Dermatology | Primary: Dermatology

## 2023-03-09 DIAGNOSIS — D869 Sarcoidosis, unspecified: Principal | ICD-10-CM

## 2023-03-09 DIAGNOSIS — C84A0 Cutaneous T-cell lymphoma, unspecified, unspecified site: Principal | ICD-10-CM

## 2023-03-10 ENCOUNTER — Ambulatory Visit
Admit: 2023-03-10 | Discharge: 2023-03-11 | Payer: MEDICARE | Attending: Nurse Practitioner | Primary: Nurse Practitioner

## 2023-03-18 ENCOUNTER — Ambulatory Visit: Admit: 2023-03-18 | Discharge: 2023-03-19 | Payer: MEDICARE

## 2023-03-18 DIAGNOSIS — Z1211 Encounter for screening for malignant neoplasm of colon: Principal | ICD-10-CM

## 2023-03-18 DIAGNOSIS — E785 Hyperlipidemia, unspecified: Principal | ICD-10-CM

## 2023-03-18 MED ORDER — ATORVASTATIN 40 MG TABLET
ORAL_TABLET | Freq: Every day | ORAL | 0 refills | 360 days | Status: CP
Start: 2023-03-18 — End: 2024-03-17

## 2023-03-18 MED ORDER — EMPAGLIFLOZIN 25 MG TABLET
ORAL_TABLET | Freq: Every morning | ORAL | 3 refills | 90.00 days | Status: CP
Start: 2023-03-18 — End: 2024-03-13

## 2023-03-25 ENCOUNTER — Ambulatory Visit: Admit: 2023-03-25 | Discharge: 2023-03-26 | Payer: MEDICARE | Attending: Oncology | Primary: Oncology

## 2023-03-25 MED ORDER — APIXABAN 5 MG TABLET
ORAL_TABLET | Freq: Two times a day (BID) | ORAL | 0 refills | 30.00 days | Status: CP
Start: 2023-03-25 — End: ?

## 2023-03-30 MED ORDER — RIVAROXABAN 20 MG TABLET
ORAL_TABLET | Freq: Every day | ORAL | 2 refills | 30.00 days | Status: CP
Start: 2023-03-30 — End: 2023-06-28

## 2023-04-03 ENCOUNTER — Other Ambulatory Visit: Payer: Self-pay | Admitting: Internal Medicine

## 2023-04-03 ENCOUNTER — Other Ambulatory Visit: Payer: Self-pay | Admitting: Emergency Medicine

## 2023-04-03 ENCOUNTER — Other Ambulatory Visit: Payer: Self-pay

## 2023-04-06 ENCOUNTER — Other Ambulatory Visit: Payer: Self-pay

## 2023-04-07 MED ORDER — RIVAROXABAN 20 MG TABLET
ORAL_TABLET | Freq: Every day | ORAL | 2 refills | 30 days | Status: CP
Start: 2023-04-07 — End: ?

## 2023-04-09 ENCOUNTER — Other Ambulatory Visit: Payer: Self-pay

## 2023-04-09 ENCOUNTER — Ambulatory Visit
Admission: EM | Admit: 2023-04-09 | Discharge: 2023-04-09 | Disposition: A | Discharge status: Home / Self Care | Attending: Emergency Medicine | Admitting: Emergency Medicine

## 2023-04-09 DIAGNOSIS — R931 Abnormal findings on diagnostic imaging of heart and coronary circulation: Principal | ICD-10-CM

## 2023-04-09 DIAGNOSIS — Z760 Encounter for issue of repeat prescription: Secondary | ICD-10-CM | POA: Insufficient documentation

## 2023-04-09 DIAGNOSIS — R102 Pelvic and perineal pain: Secondary | ICD-10-CM | POA: Diagnosis present

## 2023-04-09 LAB — URINALYSIS, W/ REFLEX TO CULTURE (INFECTION SUSPECTED)
Bilirubin Urine: NEGATIVE
Glucose, UA: 100 mg/dL — AB
Ketones, ur: NEGATIVE mg/dL
Leukocytes,Ua: NEGATIVE
Nitrite: NEGATIVE
Protein, ur: 100 mg/dL — AB
Specific Gravity, Urine: >1.03 — ABNORMAL HIGH (ref 1.005–1.030)
pH: 5.5 (ref 5.0–8.0)

## 2023-04-09 MED ORDER — RIVAROXABAN 20 MG PO TABS
20.0000 mg | ORAL_TABLET | Freq: Every day | ORAL | 0 refills | Status: DC
  Filled 2023-04-09 – 2023-04-15 (×5): qty 30, 30d supply, fill #0

## 2023-04-09 NOTE — Discharge Instructions (Signed)
 Your urinalysis did not show any signs of infection though it does show that you have some protein and red blood cells in your urine which could represent bladder irritation.  As we discussed, this may be related to scar tissue formation from your previous surgeries.  Call your primary care doctor and make a follow-up appointment to discuss your 2 months worth of abdominal pain as I feel you need imaging of your abdomen to evaluate for any scar tissue or adhesions that may have formed.  If you develop any sharp, constant abdominal pain, fever, nausea or vomiting, inability to move your bowels, or inability to urinate you need to go to the ER for evaluation.

## 2023-04-09 NOTE — ED Triage Notes (Addendum)
 Abdominal pain x 2 months lower abdominal pain.  Last BM this morning, normal. Patient states that nothing makes it worse or better, Patient states that lower abdominal pain feels better when he pee's in the morning. Pain is a 10-10 before peeing and a 5-10 after peeing Patient states that he is also out of Xarelto. He's been out since yesterday. Patient states that he contacted his PCP to refill, but didn't tell them to send it to [facility] pharmacy because of the co-pay. They sent it to [facility]. Patient states that he needs a refill sent to [facility] pharmacy. I did instruct the patient to call his PCP and have them send t to the preferred pharmacy. Dr Chaney Malling refilled Xarelto on 03/03/23

## 2023-04-09 NOTE — ED Provider Notes (Addendum)
 [facility]    CSN: [id number] Arrival date & time: 04/09/23  1439      History   Chief Complaint Chief Complaint  Patient presents with   Abdominal Pain    HPI David Robles is a 58 y.o. male.   HPI  58 year old male with past med history significant for sarcoidosis, PE, DVT, diverticulitis, diabetes, and anxiety presents for evaluation of 2 months worth of constant lower abdominal pain.  He reports that there is no provoking factors though he states that in the morning when he has to urinate his pain is a 10/10.  After urinating his pain goes down to a 5/10.  He denies any pain with urination or urinary urgency or frequency.  No increased nocturia.  No changes to his stream.  He also denies fever, nausea, vomiting, constipation, or diarrhea.  His last normal bowel movement was this morning.  Past Medical History:  Diagnosis Date   Anxiety    Diabetes mellitus without complication (HCC)    Diverticulitis    Pulmonary embolism (HCC) 2004   Sarcoidosis     Patient Active Problem List   Diagnosis Date Noted   Chest pain 07/29/2017   Sarcoidosis 07/29/2017   Diabetes (HCC) 07/29/2017    Past Surgical History:  Procedure Laterality Date   ABDOMINAL HERNIA REPAIR  2014   COLECTOMY  2004   COLOSTOMY REVISION  2004       Home Medications    Prior to Admission medications   Medication Sig Start Date End Date Taking? Authorizing Provider  atorvastatin (LIPITOR) 40 MG tablet Take 1 tablet by mouth daily. 06/16/22 06/16/23 Yes [provider]  empagliflozin (JARDIANCE) 10 MG TABS tablet Take by mouth. 10/25/20  Yes [provider]  losartan (COZAAR) 50 MG tablet Take by mouth. 06/25/20 04/09/23 Yes [provider]  metoprolol (TOPROL-XL) 200 MG 24 hr tablet Take 1 tablet by mouth at bedtime. 01/12/20 04/09/23 Yes [provider]  rivaroxaban (XARELTO) 20 MG TABS tablet Take 1 tablet (20 mg total) by mouth daily with supper.  04/09/23  Yes Becky Augusta, NP  RIVAROXABAN Carlena Hurl) VTE STARTER PACK (15 & 20 MG) Follow package directions: Take one 15mg  tablet by mouth twice a day. On day 22, switch to one 20mg  tablet once a day. Take with food. 03/03/23  Yes Domenick Gong, MD  rosuvastatin (CRESTOR) 10 MG tablet Take 10 mg by mouth daily.  02/11/17  Yes [provider]  albuterol (PROVENTIL HFA;VENTOLIN HFA) 108 (90 Base) MCG/ACT inhaler Inhale into the lungs every 6 (six) hours as needed for wheezing or shortness of breath.    [provider]  candesartan (ATACAND) 8 MG tablet TAKE 1 TABLET BY MOUTH EVERY DAY IN THE MORNING 06/05/17   [provider]  fluticasone (FLONASE) 50 MCG/ACT nasal spray Use 1 spray in each nostril daily as needed. 08/22/20   [provider]  metFORMIN (GLUCOPHAGE) 500 MG tablet Take 500 mg by mouth 2 (two) times daily with a meal.    [provider]    Family History Family History  Problem Relation Age of Onset   Stroke Father     Social History Social History   Tobacco Use   Smoking status: Former    Types: Cigarettes   Smokeless tobacco: Never  Vaping Use   Vaping status: Never Used  Substance Use Topics   Alcohol use: Yes    Comment: socially   Drug use: Yes    Frequency:  2.0 times per week    Types: Marijuana     Allergies   Rosuvastatin   Review of Systems Review of Systems  Constitutional:  Negative for fever.  Gastrointestinal:  Positive for abdominal pain. Negative for constipation, diarrhea, nausea and vomiting.  Genitourinary:  Negative for dysuria, frequency, hematuria and urgency.     Physical Exam Triage Vital Signs ED Triage Vitals  Encounter Vitals Group     BP      Systolic BP Percentile      Diastolic BP Percentile      Pulse      Resp      Temp      Temp src      SpO2      Weight      Height      Head Circumference      Peak Flow      Pain Score      Pain Loc      Pain Education       Exclude from Growth Chart    No data found.  Updated Vital Signs BP 136/81 (BP Location: Right Arm)   Pulse 73   Temp 98.2 F (36.8 C) (Oral)   Resp 15   SpO2 98%   Visual Acuity Right Eye Distance:   Left Eye Distance:   Bilateral Distance:    Right Eye Near:   Left Eye Near:    Bilateral Near:     Physical Exam Vitals and nursing note reviewed.  Constitutional:      Appearance: Normal appearance.  Cardiovascular:     Rate and Rhythm: Normal rate and regular rhythm.     Pulses: Normal pulses.     Heart sounds: Normal heart sounds. No murmur heard.    No friction rub. No gallop.  Pulmonary:     Effort: Pulmonary effort is normal.     Breath sounds: Normal breath sounds. No wheezing, rhonchi or rales.  Abdominal:     General: Abdomen is flat.     Palpations: Abdomen is soft.     Tenderness: There is abdominal tenderness. There is no guarding or rebound.  Skin:    General: Skin is warm.     Capillary Refill: Capillary refill takes less than 2 seconds.  Neurological:     General: No focal deficit present.     Mental Status: He is alert and oriented to person, place, and time.      UC Treatments / Results  Labs (all labs ordered are listed, but only abnormal results are displayed) Labs Reviewed  URINALYSIS, W/ REFLEX TO CULTURE (INFECTION SUSPECTED) - Abnormal; Notable for the following components:      Result Value   Specific Gravity, Urine >1.030 (*)    Glucose, UA 100 (*)    Hgb urine dipstick TRACE (*)    Protein, ur 100 (*)    Bacteria, UA FEW (*)    All other components within normal limits    EKG   Radiology No results found.  Procedures Procedures (including critical care time)  Medications Ordered in UC Medications - No data to display  Initial Impression / Assessment and Plan / UC Course  I have reviewed the triage vital signs and the nursing notes.  Pertinent labs & imaging results that were available during my care of the patient  were reviewed by me and considered in my medical decision making (see chart for details).   Patient is a nontoxic-appearing 58 year old male presenting for evaluation  of 2 months worth of lower abdominal pain as outlined HPI above.  In the exam room the patient does have tenderness in his suprapubic region over top of where he has a scar from previous abdominal surgery and a colostomy.  He has no guarding or rebound.  The rest of his abdomen is benign.  He denies any pain with urination or urinary urgency or frequency.  Also no changes to his stream to suggest possible prostatitis.  I will order urinalysis to rule out the presence of UTI.  I suspect that most likely the patient has scar tissue that is coming in contact with his bladder when it is distended.  The bladder is not currently palpable above the pelvic rim.  Nothing about the patient's exam or presentation suggest that he is Possible bladder outlet obstruction.  I have advised the patient if his urine is negative for UTI he needs to follow-up with his primary care provider as he will need either ultrasound or CT scan of his abdomen to rule out any scar tissue or adhesions which might be causing his discomfort.  Urinalysis shows high specific gravity with 100 glucose, trace hemoglobin, 100 protein, negative for leukocyte esterase or nitrates.  Reflex microscopy shows 11-20 RBCs with few bacteria and mucus present.  I will discharge patient home with diagnosis of suprapubic abdominal pain and have him follow-up with his primary care provider for imaging of his abdomen.  If you develop any sharp, constant abdominal pain, fever, nausea or vomiting, inability to move his bowels, or inability to urinate he should go to the ER for evaluation.   Final Clinical Impressions(s) / UC Diagnoses   Final diagnoses:  Suprapubic pain  Medication refill     Discharge Instructions      Your urinalysis did not show any signs of infection though it does  show that you have some protein and red blood cells in your urine which could represent bladder irritation.  As we discussed, this may be related to scar tissue formation from your previous surgeries.  Call your primary care doctor and make a follow-up appointment to discuss your 2 months worth of abdominal pain as I feel you need imaging of your abdomen to evaluate for any scar tissue or adhesions that may have formed.  If you develop any sharp, constant abdominal pain, fever, nausea or vomiting, inability to move your bowels, or inability to urinate you need to go to the ER for evaluation.     ED Prescriptions     Medication Sig Dispense Auth. Provider   rivaroxaban (XARELTO) 20 MG TABS tablet Take 1 tablet (20 mg total) by mouth daily with supper. 30 tablet Becky Augusta, NP      PDMP not reviewed this encounter.   Becky Augusta, NP 04/09/23 1538    Becky Augusta, NP 04/09/23 1539

## 2023-04-10 ENCOUNTER — Other Ambulatory Visit: Payer: Self-pay

## 2023-04-10 MED ORDER — RIVAROXABAN 20 MG TABLET
ORAL_TABLET | Freq: Every day | ORAL | 0 refills | 30 days | Status: CP
Start: 2023-04-10 — End: ?

## 2023-04-13 ENCOUNTER — Ambulatory Visit: Admit: 2023-04-13 | Discharge: 2023-04-14 | Payer: Medicare (Managed Care)

## 2023-04-13 DIAGNOSIS — R319 Hematuria, unspecified: Principal | ICD-10-CM

## 2023-04-13 DIAGNOSIS — R102 Pelvic and perineal pain: Principal | ICD-10-CM

## 2023-04-13 DIAGNOSIS — I824Z1 Acute embolism and thrombosis of unspecified deep veins of right distal lower extremity: Principal | ICD-10-CM

## 2023-04-15 ENCOUNTER — Other Ambulatory Visit: Payer: Self-pay

## 2023-04-20 ENCOUNTER — Ambulatory Visit: Admit: 2023-04-20 | Discharge: 2023-04-21 | Attending: Pharmacist | Primary: Pharmacist

## 2023-04-20 DIAGNOSIS — E785 Hyperlipidemia, unspecified: Principal | ICD-10-CM

## 2023-04-20 MED ORDER — WARFARIN 5 MG TABLET
ORAL_TABLET | Freq: Every evening | ORAL | 2 refills | 30.00 days | Status: CP
Start: 2023-04-20 — End: ?

## 2023-04-20 MED ORDER — ENOXAPARIN 100 MG/ML SUBCUTANEOUS SYRINGE
Freq: Two times a day (BID) | SUBCUTANEOUS | 0 refills | 5.00 days | Status: CP
Start: 2023-04-20 — End: ?

## 2023-04-23 ENCOUNTER — Ambulatory Visit: Admit: 2023-04-23 | Discharge: 2023-04-24 | Payer: Medicare (Managed Care) | Attending: Surgery | Primary: Surgery

## 2023-04-28 MED ORDER — PEG 3350-ELECTROLYTES 236 GRAM-22.74 GRAM-6.74 GRAM-5.86 GRAM SOLUTION
0 refills | 0.00 days | Status: CP
Start: 2023-04-28 — End: ?

## 2023-05-01 ENCOUNTER — Encounter: Admit: 2023-05-01 | Discharge: 2023-05-01 | Payer: MEDICARE | Attending: Anesthesiology | Primary: Anesthesiology

## 2023-05-01 ENCOUNTER — Inpatient Hospital Stay: Admit: 2023-05-01 | Discharge: 2023-05-01 | Payer: Medicare (Managed Care)

## 2023-05-19 ENCOUNTER — Ambulatory Visit: Admit: 2023-05-19 | Discharge: 2023-05-20 | Payer: Medicare (Managed Care)

## 2023-05-19 DIAGNOSIS — I824Z1 Acute embolism and thrombosis of unspecified deep veins of right distal lower extremity: Principal | ICD-10-CM

## 2023-05-19 DIAGNOSIS — Z86711 Personal history of pulmonary embolism: Principal | ICD-10-CM

## 2023-05-19 DIAGNOSIS — I10 Essential (primary) hypertension: Principal | ICD-10-CM

## 2023-05-19 MED ORDER — WARFARIN 5 MG TABLET
ORAL_TABLET | ORAL | 2 refills | 0.00000 days | Status: CP
Start: 2023-05-19 — End: ?

## 2023-05-28 ENCOUNTER — Emergency Department
Admit: 2023-05-28 | Discharge: 2023-05-28 | Disposition: A | Discharge status: Home / Self Care | Payer: Medicare (Managed Care)

## 2023-05-28 DIAGNOSIS — N200 Calculus of kidney: Principal | ICD-10-CM

## 2023-05-28 MED ORDER — ONDANSETRON 4 MG DISINTEGRATING TABLET
ORAL_TABLET | Freq: Three times a day (TID) | 0 refills | 5.00000 days | Status: CP | PRN
Start: 2023-05-28 — End: 2023-06-04

## 2023-05-28 MED ORDER — OXYCODONE 5 MG TABLET
ORAL_TABLET | Freq: Two times a day (BID) | ORAL | 0 refills | 5.00000 days | Status: CP | PRN
Start: 2023-05-28 — End: 2023-06-02

## 2023-05-28 MED ORDER — TAMSULOSIN 0.4 MG CAPSULE
ORAL_CAPSULE | Freq: Every day | ORAL | 0 refills | 30.00000 days | Status: CP
Start: 2023-05-28 — End: ?

## 2023-06-12 ENCOUNTER — Ambulatory Visit: Admit: 2023-06-12 | Discharge: 2023-06-12 | Payer: Medicare (Managed Care)

## 2023-06-12 DIAGNOSIS — I824Z1 Acute embolism and thrombosis of unspecified deep veins of right distal lower extremity: Principal | ICD-10-CM

## 2023-06-12 DIAGNOSIS — E1100 Type 2 diabetes mellitus with hyperosmolarity without nonketotic hyperglycemic-hyperosmolar coma (NKHHC): Principal | ICD-10-CM

## 2023-06-12 DIAGNOSIS — N23 Unspecified renal colic: Principal | ICD-10-CM

## 2023-06-12 DIAGNOSIS — R931 Abnormal findings on diagnostic imaging of heart and coronary circulation: Principal | ICD-10-CM

## 2023-06-12 DIAGNOSIS — E119 Type 2 diabetes mellitus without complications: Principal | ICD-10-CM

## 2023-07-28 ENCOUNTER — Ambulatory Visit: Admit: 2023-07-28 | Discharge: 2023-07-29 | Payer: Medicare (Managed Care)

## 2023-07-28 DIAGNOSIS — R102 Pelvic and perineal pain: Principal | ICD-10-CM

## 2023-07-28 DIAGNOSIS — N200 Calculus of kidney: Principal | ICD-10-CM

## 2023-08-11 DIAGNOSIS — I10 Essential (primary) hypertension: Principal | ICD-10-CM

## 2023-08-11 DIAGNOSIS — I824Z1 Acute embolism and thrombosis of unspecified deep veins of right distal lower extremity: Principal | ICD-10-CM

## 2023-08-11 DIAGNOSIS — Z86711 Personal history of pulmonary embolism: Principal | ICD-10-CM

## 2023-08-11 MED ORDER — WARFARIN 5 MG TABLET
ORAL_TABLET | 2 refills | 0.00000 days
Start: 2023-08-11 — End: ?

## 2023-09-29 ENCOUNTER — Ambulatory Visit
Admission: EM | Admit: 2023-09-29 | Discharge: 2023-09-29 | Disposition: A | Discharge status: Home / Self Care | Attending: Emergency Medicine | Admitting: Emergency Medicine

## 2023-09-29 ENCOUNTER — Ambulatory Visit: Payer: Self-pay | Admitting: Emergency Medicine

## 2023-09-29 ENCOUNTER — Encounter: Payer: Self-pay | Admitting: Emergency Medicine

## 2023-09-29 DIAGNOSIS — J029 Acute pharyngitis, unspecified: Secondary | ICD-10-CM | POA: Insufficient documentation

## 2023-09-29 DIAGNOSIS — M542 Cervicalgia: Secondary | ICD-10-CM | POA: Insufficient documentation

## 2023-09-29 LAB — GROUP A STREP BY PCR: Group A Strep by PCR: NOT DETECTED

## 2023-09-29 MED ORDER — FAMOTIDINE 20 MG PO TABS: 20.0000 mg | ORAL_TABLET | Freq: Two times a day (BID) | ORAL | 0 refills | Status: AC

## 2023-09-29 MED ORDER — PANTOPRAZOLE SODIUM 20 MG PO TBEC: 20.0000 mg | DELAYED_RELEASE_TABLET | Freq: Every day | ORAL | 0 refills | Status: AC

## 2023-09-29 NOTE — ED Triage Notes (Signed)
 Pt presents with a sore throat x 3 months. Pt has not taken anything for the pain.

## 2023-09-29 NOTE — Discharge Instructions (Signed)
 I will contact you if and only if your strep PCR is positive and I will prescribe the appropriate antibiotics in that case.  Believe that this is musculoskeletal plus or minus acid reflux.  Take the Pepcid  and Protonix .  It may take 3 days for it to have its full effect.  Take it as milligrams of Tylenol  3-4 times a day as needed for pain.  Follow-up with [facility] in [place] or [place] if symptoms do not resolve in a week or 2.

## 2023-09-29 NOTE — ED Provider Notes (Signed)
 HPI  SUBJECTIVE:  Patient reports a constant, daily sore throat starting 3 months ago described as dull achiness.  He states it feels like the pain is located primarily in the sides of his neck.  No fevers or nasal congestion, rhinorrhea, sinus pain or pressure, postnasal drip.  He reports burning chest pain, but denies belching, waterbrash.  No allergy symptoms.  No voice changes, drooling, trismus, neck stiffness.  He denies difficulty swallowing, states food is not getting stuck.  No cough.  No recent change in medications.  No pain with neck range of motion.  No aggravating or alleviating factors.  He has not tried anything for this. He has a past medical history of diabetes, PE on Coumadin, sarcoidosis, GERD, but is not on any medications for it.  No history of allergies.  PCP: [facility].  Past Medical History:  Diagnosis Date   Anxiety    Diabetes mellitus without complication (HCC)    Diverticulitis    Pulmonary embolism (HCC) 2004   Sarcoidosis     Past Surgical History:  Procedure Laterality Date   ABDOMINAL HERNIA REPAIR  2014   COLECTOMY  2004   COLOSTOMY REVISION  2004    Family History  Problem Relation Age of Onset   Stroke Father     Social History   Tobacco Use   Smoking status: Former    Types: Cigarettes   Smokeless tobacco: Never  Vaping Use   Vaping status: Never Used  Substance Use Topics   Alcohol use: Yes    Comment: socially   Drug use: Yes    Frequency: 2.0 times per week    Types: Marijuana    No current facility-administered medications for this encounter.  Current Outpatient Medications:    famotidine  (PEPCID ) 20 MG tablet, Take 1 tablet (20 mg total) by mouth 2 (two) times daily., Disp: 40 tablet, Rfl: 0   pantoprazole  (PROTONIX ) 20 MG tablet, Take 1 tablet (20 mg total) by mouth daily., Disp: 30 tablet, Rfl: 0   albuterol  (PROVENTIL  HFA;VENTOLIN  HFA) 108 (90 Base) MCG/ACT inhaler, Inhale into the lungs every 6 (six) hours as  needed for wheezing or shortness of breath., Disp: , Rfl:    atorvastatin (LIPITOR) 40 MG tablet, Take 1 tablet by mouth daily., Disp: , Rfl:    candesartan (ATACAND) 8 MG tablet, TAKE 1 TABLET BY MOUTH EVERY DAY IN THE MORNING, Disp: , Rfl: 2   empagliflozin (JARDIANCE) 10 MG TABS tablet, Take by mouth., Disp: , Rfl:    fluticasone (FLONASE) 50 MCG/ACT nasal spray, Use 1 spray in each nostril daily as needed., Disp: , Rfl:    losartan (COZAAR) 50 MG tablet, Take by mouth., Disp: , Rfl:    metFORMIN (GLUCOPHAGE) 500 MG tablet, Take 500 mg by mouth 2 (two) times daily with a meal., Disp: , Rfl:    metoprolol (TOPROL-XL) 200 MG 24 hr tablet, Take 1 tablet by mouth at bedtime., Disp: , Rfl:    rosuvastatin  (CRESTOR ) 10 MG tablet, Take 10 mg by mouth daily. , Disp: , Rfl:    warfarin (COUMADIN) 5 MG tablet, Take by mouth., Disp: , Rfl:   Allergies  Allergen Reactions   Rosuvastatin  Other (See Comments)     ROS  As noted in HPI.   Physical Exam  BP (!) 140/86 (BP Location: Left Arm)   Pulse 66   Temp 98.1 F (36.7 C) (Oral)   Resp 16   SpO2 99%   Constitutional: Well developed, well  nourished, no acute distress Eyes:  EOMI, conjunctiva normal bilaterally HENT: Normocephalic, atraumatic,mucus membranes moist.  No nasal congestion.  Normal oropharynx.  Normal tonsils without exudates.  Uvula midline.   No neck stiffness.  Normal voice.  No drooling, trismus. Neck: No cervical lymphadenopathy.  Mild tenderness along the anterior right sternocleidomastoid, aggravated with lateral head bending to the left.  No other sternocleidomastoid or tracheal tenderness.  No pain with neck flexion/extension, rotation.  Thyroid nontender, normal size.  No appreciable masses.  No carotid bruit. Respiratory: Normal inspiratory effort Cardiovascular: Normal rate, no murmurs, rubs, gallops GI: nondistended, nontender. No appreciable splenomegaly skin: No rash, skin intact Lymph: No anterior cervical LN.   No posterior cervical lymphadenopathy. Musculoskeletal: no deformities Neurologic: Alert & oriented x 3, no focal neuro deficits Psychiatric: Speech and behavior appropriate.  ED Course   Medications - No data to display  Orders Placed This Encounter  Procedures   Group A Strep by PCR    Standing Status:   Standing    Number of Occurrences:   1   Ambulatory referral to ENT    Referral Priority:   Routine    Referral Type:   Consultation    Referral Reason:   Specialty Services Required    Referred to Provider:   Milissa Hamming, MD    Requested Specialty:   Otolaryngology    Number of Visits Requested:   1    Results for orders placed or performed during the hospital encounter of 09/29/23 (from the past 24 hours)  Group A Strep by PCR     Status: None   Collection Time: 09/29/23  9:57 AM   Specimen: Throat; Sterile Swab  Result Value Ref Range   Group A Strep by PCR NOT DETECTED NOT DETECTED   No results found.  ED Clinical Impression  1. Neck pain   2. Sore throat      ED Assessment/Plan    Patient presents with sore throat/bilateral neck pain for the past 3 months.  Suspect acid reflux versus musculoskeletal as it is reproducible.  He has no lymphadenopathy, carotid bruit or thyromegaly, thyroid tenderness.  Will send home with Tylenol , Pepcid /Protonix , and refer to ENT if symptoms do not resolve with this.  Strep PCR negative.  Plan as above.  Discussed labs,  MDM, plan and followup with patient. Discussed sn/sx that should prompt return to the ED. patient agrees with plan.   Meds ordered this encounter  Medications   famotidine  (PEPCID ) 20 MG tablet    Sig: Take 1 tablet (20 mg total) by mouth 2 (two) times daily.    Dispense:  40 tablet    Refill:  0   pantoprazole  (PROTONIX ) 20 MG tablet    Sig: Take 1 tablet (20 mg total) by mouth daily.    Dispense:  30 tablet    Refill:  0     *This clinic note was created using Scientist, clinical (histocompatibility and immunogenetics).  Therefore, there may be occasional mistakes despite careful proofreading.     Van Knee, MD 09/29/23 [phone]

## 2023-10-07 DIAGNOSIS — I824Z1 Acute embolism and thrombosis of unspecified deep veins of right distal lower extremity: Principal | ICD-10-CM

## 2023-10-07 DIAGNOSIS — Z86711 Personal history of pulmonary embolism: Principal | ICD-10-CM

## 2023-10-07 DIAGNOSIS — M542 Cervicalgia: Principal | ICD-10-CM

## 2023-11-03 ENCOUNTER — Inpatient Hospital Stay: Admit: 2023-11-03 | Discharge: 2023-11-03 | Payer: Medicare (Managed Care)

## 2023-11-03 DIAGNOSIS — R599 Enlarged lymph nodes, unspecified: Principal | ICD-10-CM

## 2023-11-03 DIAGNOSIS — E041 Nontoxic single thyroid nodule: Principal | ICD-10-CM

## 2023-11-07 DIAGNOSIS — I824Z1 Acute embolism and thrombosis of unspecified deep veins of right distal lower extremity: Principal | ICD-10-CM

## 2023-11-07 DIAGNOSIS — Z86711 Personal history of pulmonary embolism: Principal | ICD-10-CM

## 2023-11-07 MED ORDER — WARFARIN 5 MG TABLET
ORAL_TABLET | 2 refills | 0.00000 days
Start: 2023-11-07 — End: ?

## 2023-11-10 MED ORDER — WARFARIN 5 MG TABLET
ORAL_TABLET | ORAL | 2 refills | 0.00000 days | Status: CP
Start: 2023-11-10 — End: ?

## 2023-11-12 DIAGNOSIS — E041 Nontoxic single thyroid nodule: Principal | ICD-10-CM

## 2023-11-19 DIAGNOSIS — Z86711 Personal history of pulmonary embolism: Principal | ICD-10-CM

## 2023-11-19 DIAGNOSIS — I824Z1 Acute embolism and thrombosis of unspecified deep veins of right distal lower extremity: Principal | ICD-10-CM

## 2023-11-19 MED ORDER — WARFARIN 5 MG TABLET
ORAL_TABLET | 2 refills | 0.00000 days
Start: 2023-11-19 — End: ?

## 2023-11-23 MED ORDER — WARFARIN 5 MG TABLET
ORAL_TABLET | 2 refills | 0.00000 days
Start: 2023-11-23 — End: ?

## 2023-12-03 ENCOUNTER — Inpatient Hospital Stay: Admit: 2023-12-03 | Discharge: 2023-12-03 | Payer: Medicare (Managed Care)

## 2024-02-08 DIAGNOSIS — E041 Nontoxic single thyroid nodule: Secondary | ICD-10-CM

## 2024-02-08 DIAGNOSIS — R599 Enlarged lymph nodes, unspecified: Principal | ICD-10-CM
